# Patient Record
Sex: Female | Born: 1995 | Race: Black or African American | Hispanic: No | Marital: Single | Smoking: Never smoker
Health system: Southern US, Community
[De-identification: ages and names within clinical notes are randomized; demographics above are authoritative.]

## PROBLEM LIST (undated history)

## (undated) DIAGNOSIS — N62 Hypertrophy of breast: Secondary | ICD-10-CM

## (undated) DIAGNOSIS — T883XXA Malignant hyperthermia due to anesthesia, initial encounter: Secondary | ICD-10-CM

## (undated) DIAGNOSIS — F419 Anxiety disorder, unspecified: Secondary | ICD-10-CM

## (undated) DIAGNOSIS — Z8489 Family history of other specified conditions: Secondary | ICD-10-CM

## (undated) DIAGNOSIS — R519 Headache, unspecified: Secondary | ICD-10-CM

## (undated) DIAGNOSIS — S43006A Unspecified dislocation of unspecified shoulder joint, initial encounter: Secondary | ICD-10-CM

## (undated) DIAGNOSIS — F988 Other specified behavioral and emotional disorders with onset usually occurring in childhood and adolescence: Secondary | ICD-10-CM

## (undated) DIAGNOSIS — F32A Depression, unspecified: Secondary | ICD-10-CM

## (undated) HISTORY — DX: Malignant hyperthermia due to anesthesia, initial encounter: T88.3XXA

## (undated) HISTORY — DX: Anxiety disorder, unspecified: F41.9

## (undated) HISTORY — PX: SHOULDER SURGERY: SHX246

## (undated) HISTORY — DX: Headache, unspecified: R51.9

---
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
## 2016-05-06 IMAGING — CR DG FOOT COMPLETE 3+V*R*
3 series · 3 of 3 positions shown · non-contrast
Comparison: None.

CLINICAL DATA: 18-year-old female with right foot pain and
swelling.

EXAM:
RIGHT FOOT COMPLETE - 3+ VIEW

[t foot ap right]
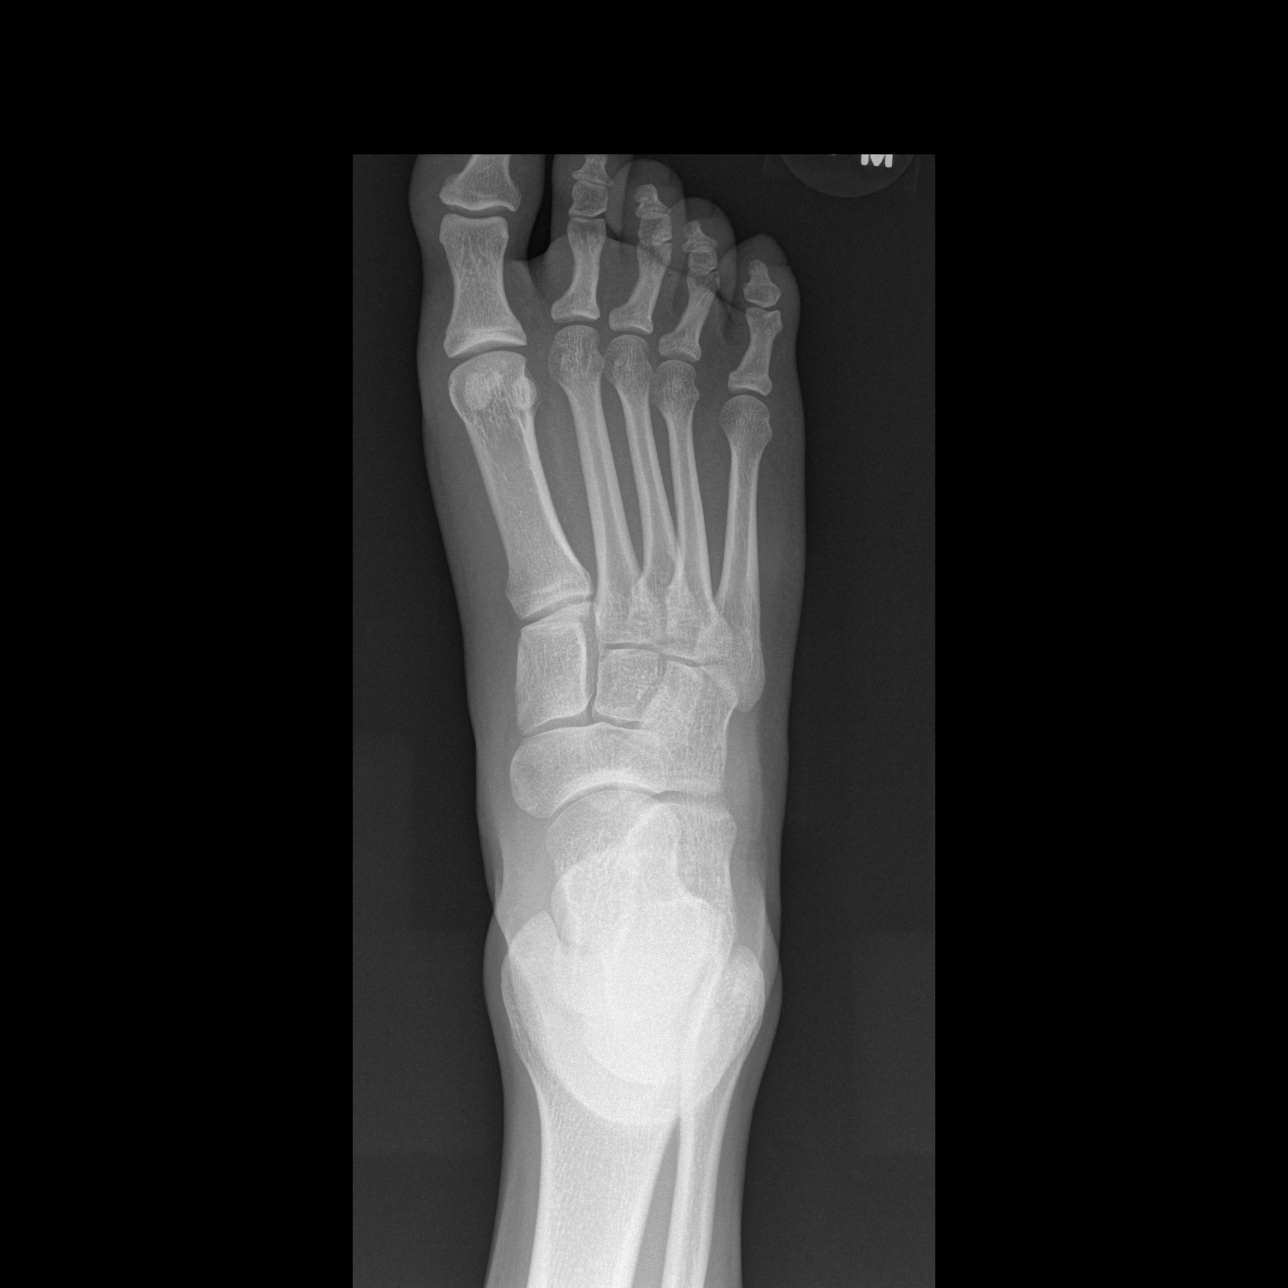

[t foot oblique right]
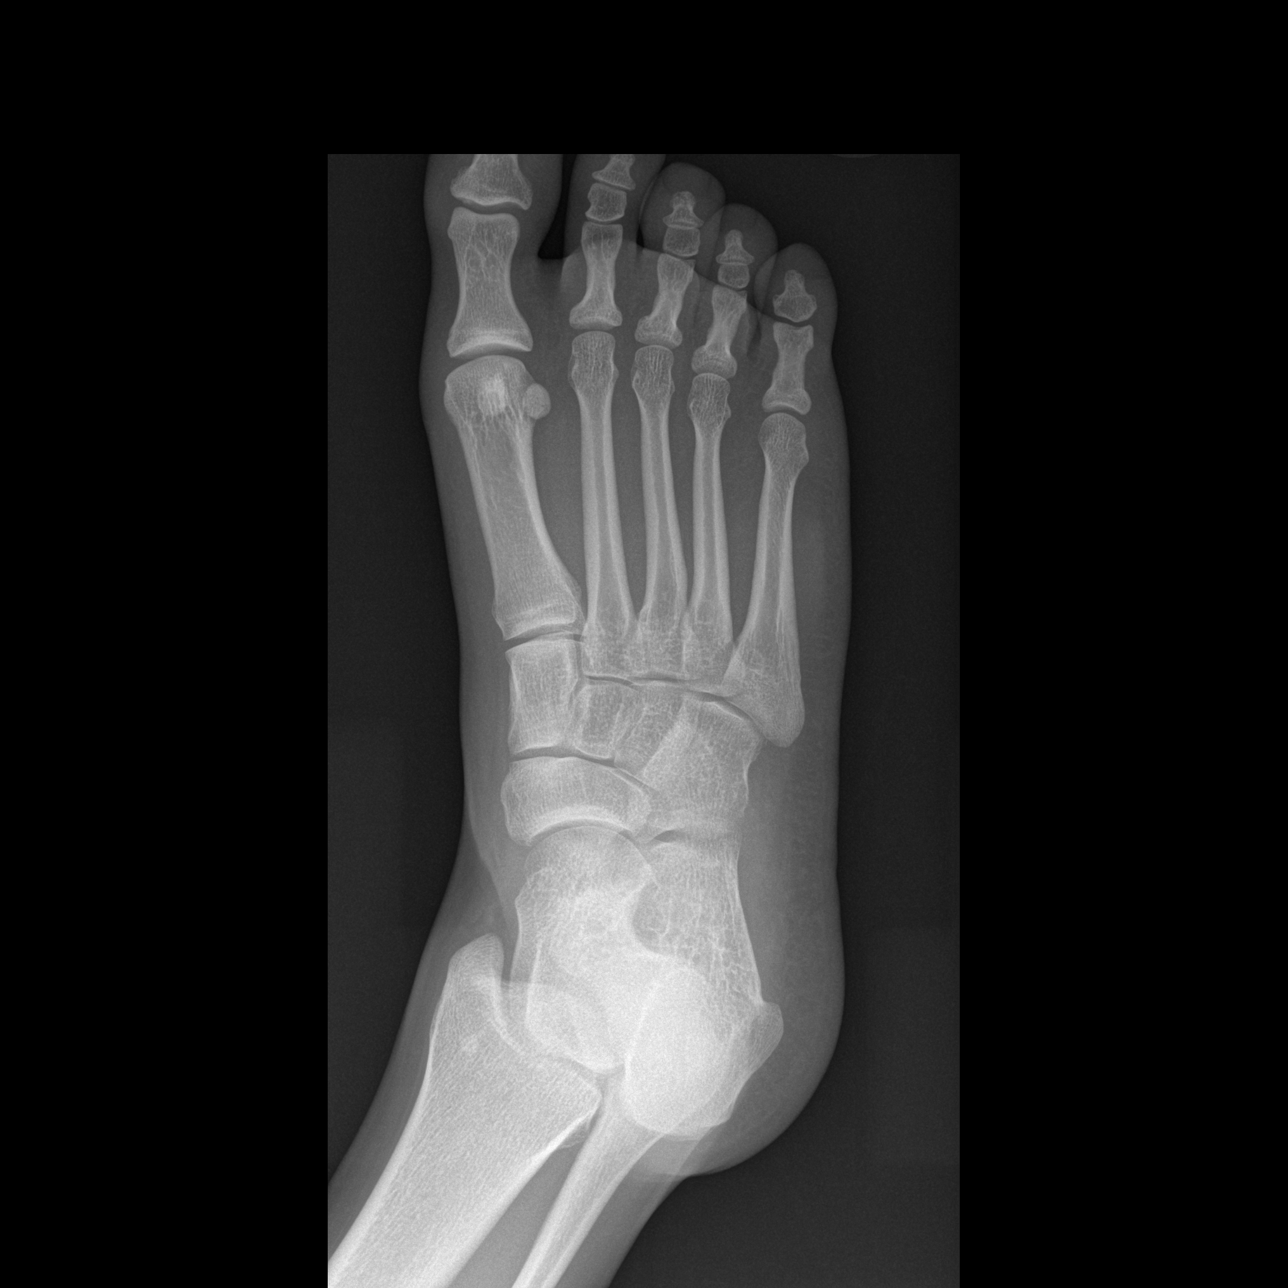

[t foot lat right]
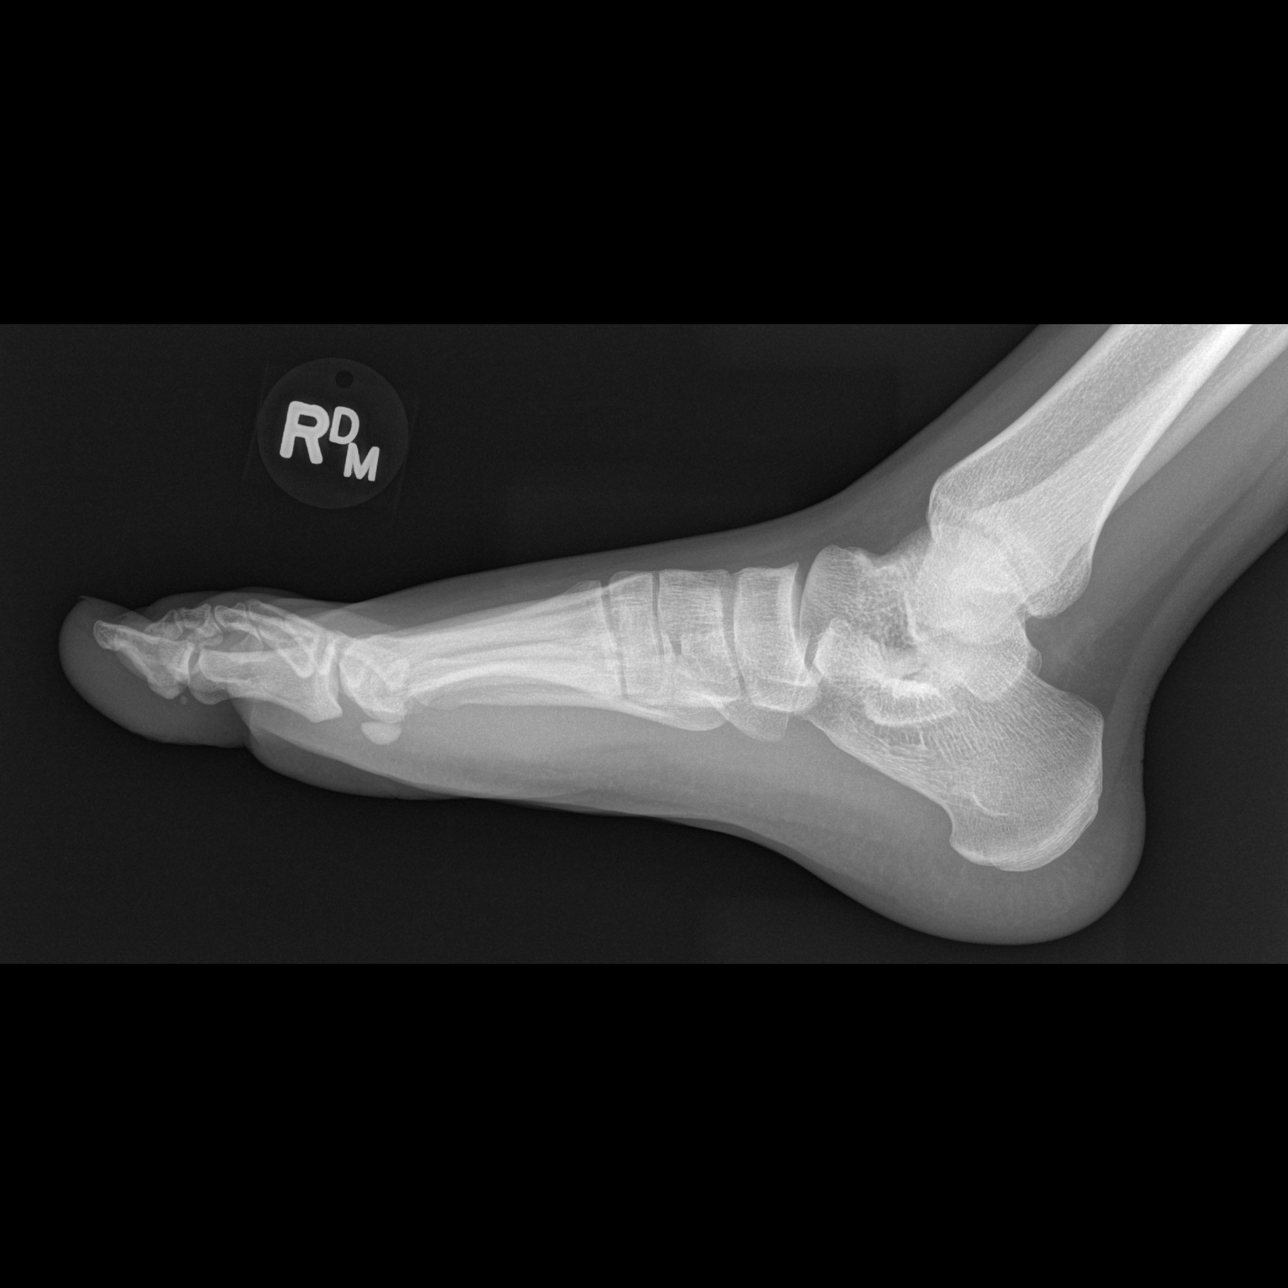

[3 of 3 positions shown; findings below may reference images not displayed]

FINDINGS: There is no evidence of fracture or dislocation. There is no
evidence of arthropathy or other focal bone abnormality. Soft
tissues are unremarkable.
IMPRESSION: Negative.

## 2017-07-13 ENCOUNTER — Emergency Department (HOSPITAL_BASED_OUTPATIENT_CLINIC_OR_DEPARTMENT_OTHER)
Admission: EM | Admit: 2017-07-13 | Discharge: 2017-07-13 | Disposition: A | Discharge status: Home / Self Care | Payer: 59 | Attending: Emergency Medicine | Admitting: Emergency Medicine

## 2017-07-13 ENCOUNTER — Encounter (HOSPITAL_BASED_OUTPATIENT_CLINIC_OR_DEPARTMENT_OTHER): Payer: Self-pay | Admitting: *Deleted

## 2017-07-13 DIAGNOSIS — Y998 Other external cause status: Secondary | ICD-10-CM | POA: Insufficient documentation

## 2017-07-13 DIAGNOSIS — Z79899 Other long term (current) drug therapy: Secondary | ICD-10-CM | POA: Diagnosis not present

## 2017-07-13 DIAGNOSIS — G501 Atypical facial pain: Secondary | ICD-10-CM | POA: Insufficient documentation

## 2017-07-13 DIAGNOSIS — W2210XA Striking against or struck by unspecified automobile airbag, initial encounter: Secondary | ICD-10-CM | POA: Insufficient documentation

## 2017-07-13 DIAGNOSIS — Y9389 Activity, other specified: Secondary | ICD-10-CM | POA: Insufficient documentation

## 2017-07-13 DIAGNOSIS — Y92410 Unspecified street and highway as the place of occurrence of the external cause: Secondary | ICD-10-CM | POA: Diagnosis not present

## 2017-07-13 LAB — PREGNANCY, URINE: PREG TEST UR: NEGATIVE

## 2017-07-13 MED ORDER — IBUPROFEN 800 MG PO TABS: 800.0000 mg | ORAL_TABLET | Freq: Three times a day (TID) | ORAL | 0 refills | Status: DC

## 2017-07-13 NOTE — ED Notes (Signed)
ED Provider at bedside. 
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
## 2017-07-13 NOTE — Discharge Instructions (Signed)
Ice to painful areas.  Expect more stiffness and soreness tomorrow.  Ibuprofen for pain.

## 2018-02-26 NOTE — Pre-Procedure Instructions (Signed)
Jill Schwartz Jill Schwartz  02/26/2018      [facility] [id number] - [place], [place] - [address] [id number] Phone: [phone] Fax: [phone]    Your procedure is scheduled on Friday 03/01/2018.  [facility].M.  Call this number if you have problems the morning of surgery:  [phone]   Remember:  Do not eat food or drink liquids after midnight.  Take these medicines the morning of surgery with A SIP OF WATER: medroxyprogesterone (Provera)  7 days prior to surgery STOP taking any Aspirin(unless otherwise instructed by your surgeon), Aleve, Naproxen, Ibuprofen, Motrin, Advil, Goody's, BC's, all herbal medications, fish oil, and all vitamins   Do not wear jewelry, make-up or nail polish.  Do not wear lotions, powders, or perfumes, or deodorant.  Do not shave 48 hours prior to surgery.    Do not bring valuables to the hospital.  [name] is not responsible for any belongings or valuables.  Hearing aids, eyeglasses, contacts, dentures or bridgework may not be worn into surgery.  Leave your suitcase in the car.  After surgery it may be brought to your room.  For patients admitted to the hospital, discharge time will be determined by your treatment team.  Patients discharged the day of surgery will not be allowed to drive home.   Name and phone number of your driver:    Special instructions:   [facility]- Preparing For Surgery  Before surgery, you can play an important role. Because skin is not sterile, your skin needs to be as free of germs as possible. You can reduce the number of germs on your skin by washing with CHG (chlorahexidine gluconate) Soap before surgery.  CHG is an antiseptic cleaner which kills germs and bonds with the skin to continue killing germs even after washing.  Please do not use if you have an allergy to CHG or antibacterial soaps. If your  skin becomes reddened/irritated stop using the CHG.  Do not shave (including legs and underarms) for at least 48 hours prior to first CHG shower. It is OK to shave your face.  Please follow these instructions carefully.   1. Shower the NIGHT BEFORE SURGERY and the MORNING OF SURGERY with CHG.   2. If you chose to wash your hair, wash your hair first as usual with your normal shampoo.  3. After you shampoo, rinse your hair and body thoroughly to remove the shampoo.  4. Use CHG as you would any other liquid soap. You can apply CHG directly to the skin and wash gently with a scrungie or a clean washcloth.   5. Apply the CHG Soap to your body ONLY FROM THE NECK DOWN.  Do not use on open wounds or open sores. Avoid contact with your eyes, ears, mouth and genitals (private parts). Wash Face and genitals (private parts)  with your normal soap.  6. Wash thoroughly, paying special attention to the area where your surgery will be performed.  7. Thoroughly rinse your body with warm water from the neck down.  8. DO NOT shower/wash with your normal soap after using and rinsing off the CHG Soap.  9. Pat yourself dry with a CLEAN TOWEL.  10. Wear CLEAN PAJAMAS to bed the night before surgery, wear comfortable clothes the morning of surgery  11. Place CLEAN SHEETS on your bed the night of  your first shower and DO NOT SLEEP WITH PETS.    Day of Surgery: Shower as stated above.  Do not apply any deodorants/lotions. Please wear clean clothes to the hospital/surgery center.      Please read over the following fact sheets that you were given.

## 2018-02-27 ENCOUNTER — Encounter (HOSPITAL_COMMUNITY): Payer: Self-pay

## 2018-02-27 ENCOUNTER — Encounter (HOSPITAL_COMMUNITY)
Admission: RE | Admit: 2018-02-27 | Discharge: 2018-02-27 | Disposition: A | Discharge status: Home / Self Care | Payer: 59 | Source: Ambulatory Visit | Attending: Plastic Surgery | Admitting: Plastic Surgery

## 2018-02-27 ENCOUNTER — Other Ambulatory Visit: Payer: Self-pay

## 2018-02-27 DIAGNOSIS — M549 Dorsalgia, unspecified: Secondary | ICD-10-CM | POA: Diagnosis not present

## 2018-02-27 DIAGNOSIS — M542 Cervicalgia: Secondary | ICD-10-CM | POA: Diagnosis not present

## 2018-02-27 DIAGNOSIS — N62 Hypertrophy of breast: Secondary | ICD-10-CM | POA: Diagnosis present

## 2018-02-27 HISTORY — DX: Family history of other specified conditions: Z84.89

## 2018-02-27 HISTORY — DX: Hypertrophy of breast: N62

## 2018-02-27 LAB — BASIC METABOLIC PANEL
Anion gap: 8 (ref 5–15)
BUN: 14 mg/dL (ref 6–20)
CHLORIDE: 109 mmol/L (ref 101–111)
CO2: 21 mmol/L — ABNORMAL LOW (ref 22–32)
CREATININE: 0.86 mg/dL (ref 0.44–1.00)
Calcium: 9.3 mg/dL (ref 8.9–10.3)
Glucose, Bld: 100 mg/dL — ABNORMAL HIGH (ref 65–99)
POTASSIUM: 4 mmol/L (ref 3.5–5.1)
SODIUM: 138 mmol/L (ref 135–145)

## 2018-02-27 LAB — CBC
HCT: 40.6 % (ref 36.0–46.0)
Hemoglobin: 13.5 g/dL (ref 12.0–15.0)
MCH: 28.9 pg (ref 26.0–34.0)
MCHC: 33.3 g/dL (ref 30.0–36.0)
MCV: 86.9 fL (ref 78.0–100.0)
PLATELETS: 335 10*3/uL (ref 150–400)
RBC: 4.67 MIL/uL (ref 3.87–5.11)
RDW: 13.4 % (ref 11.5–15.5)
WBC: 7 10*3/uL (ref 4.0–10.5)

## 2018-02-27 NOTE — Progress Notes (Signed)
PCP - Dr. Carney BernMeagan Hancock- [facility]  Cardiologist - Denies  Chest x-ray - Denies  EKG - Denies  Stress Test - Denies  ECHO - Denies  Cardiac Cath - Denies  Sleep Study - Denies CPAP - None  LABS- 02/27/18: CBC, BMP  U-PREG- 03/01/18  Anesthesia- No  Pt denies having chest pain, sob, or fever at this time. All instructions explained to the pt, with a verbal understanding of the material. Pt agrees to go over the instructions while at home for a better understanding. The opportunity to ask questions was provided.

## 2018-02-28 NOTE — H&P (Signed)
Subjective:     Patient ID: Jill Schwartz is a 22 y.o. female.  HPI  Here for follow up discussion prior to planned breast reduction. Current 34 DD and complains of several year history neck and back and shoulder pain that she attributes to weight of breast. Denies rashes. Denies numbness hands. She has tried NSAIDs, heat packs, speciatly fitted bras, physical therapy, wt loss program without relief. Notes pain in shoulders constant from bra straps. 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  Wt- highest 181 lb. Completed monitored wt loss program from May to August 2018, attained goal 165.   No prior MMG. Maternal great aunt with history breast ca.  Does have history keloid scarring ears, umbilical piercing.  Review of Systems  Musculoskeletal: Positive for back pain and neck pain.     Remaidner 12 point review negative Objective:   Physical Exam  Cardiovascular: Normal rate, regular rhythm and normal heart sounds.   Pulmonary/Chest: Effort normal and breath sounds normal.  Skin:  Fitzpatrick 6   Grade 3 ptosis bilateral No masses SN to nipple R 27.5 L 27 cm BW R 18 L 18  Nipple to IMF R 12 L 12 cm + shoulder grooving +thoracic kyphosis    Assessment:     Macromastia Chronic neck and back pain    Plan:     Chronic neck and back pain in setting macromastia that has failed conservative measures and creates impairment in basic tasks of bending lifting and limits ability to work, associated shoulder grooving, thoracic kyphosis. I anticipate reduction would allow significant pain relief.  Reviewed reduction with anchor type scars, possible overnight hospital stay or drains, post operative visits and limitations, recovery. Diminished sensation nipple and breast skin, risk of nipple loss, wound healing problems, asymmetry, incidental carcinoma, changes with wt gain/loss, aging, pregnancy, effect on breast feeding.  unacceptable cosmetic appearance reviewed.  Plan OP surgery. Additional risks including but not limited to DVT/PE, cardiopulmonary complications, fat necrosis, seroma, hematoma reviewed.Reviewed cannot assure cup size, discussed anticipate volume resection.   Rx for Norco given.   Anticipate 485 g reduction from each breast.    Glenna FellowsBrinda Bronson Bressman, MD [facility] Pl[facility] Re[facility] [phone], pin [phone]

## 2018-02-28 NOTE — Anesthesia Preprocedure Evaluation (Addendum)
Anesthesia Evaluation  Patient identified by MRN, date of birth, ID band Patient awake    Reviewed: Allergy & Precautions, H&P , NPO status , Patient's Chart, lab work & pertinent test results  History of Anesthesia Complications (+) MALIGNANT HYPERTHERMIA and Family history of anesthesia reaction  Airway Mallampati: II  TM Distance: >3 FB Neck ROM: Full    Dental no notable dental hx. (+) Teeth Intact, Dental Advisory Given   Pulmonary neg pulmonary ROS,    Pulmonary exam normal breath sounds clear to auscultation       Cardiovascular Exercise Tolerance: Good negative cardio ROS   Rhythm:Regular Rate:Normal     Neuro/Psych negative neurological ROS  negative psych ROS   GI/Hepatic negative GI ROS, Neg liver ROS,   Endo/Other  negative endocrine ROS  Renal/GU negative Renal ROS  negative genitourinary   Musculoskeletal   Abdominal   Peds  Hematology negative hematology ROS (+)   Anesthesia Other Findings   Reproductive/Obstetrics negative OB ROS                            Anesthesia Physical Anesthesia Plan  ASA: I  Anesthesia Plan: General   Post-op Pain Management:    Induction: Intravenous  PONV Risk Score and Plan: 4 or greater and Ondansetron, Dexamethasone and Midazolam  Airway Management Planned: Oral ETT  Additional Equipment:   Intra-op Plan:   Post-operative Plan: Extubation in OR  Informed Consent: I have reviewed the patients History and Physical, chart, labs and discussed the procedure including the risks, benefits and alternatives for the proposed anesthesia with the patient or authorized representative who has indicated his/her understanding and acceptance.   Dental advisory given  Plan Discussed with: CRNA  Anesthesia Plan Comments:         Anesthesia Quick Evaluation

## 2018-03-01 ENCOUNTER — Encounter (HOSPITAL_COMMUNITY)
Admission: RE | Disposition: A | Discharge status: Home / Self Care | Payer: Self-pay | Source: Ambulatory Visit | Attending: Plastic Surgery

## 2018-03-01 ENCOUNTER — Ambulatory Visit (HOSPITAL_COMMUNITY): Payer: 59 | Admitting: Anesthesiology

## 2018-03-01 ENCOUNTER — Ambulatory Visit (HOSPITAL_COMMUNITY)
Admission: RE | Admit: 2018-03-01 | Discharge: 2018-03-01 | Disposition: A | Discharge status: Home / Self Care | Payer: 59 | Source: Ambulatory Visit | Attending: Plastic Surgery | Admitting: Plastic Surgery

## 2018-03-01 ENCOUNTER — Encounter (HOSPITAL_COMMUNITY): Payer: Self-pay | Admitting: *Deleted

## 2018-03-01 DIAGNOSIS — N62 Hypertrophy of breast: Secondary | ICD-10-CM | POA: Insufficient documentation

## 2018-03-01 DIAGNOSIS — M542 Cervicalgia: Secondary | ICD-10-CM | POA: Insufficient documentation

## 2018-03-01 DIAGNOSIS — M549 Dorsalgia, unspecified: Secondary | ICD-10-CM | POA: Insufficient documentation

## 2018-03-01 HISTORY — PX: BREAST REDUCTION SURGERY: SHX8

## 2018-03-01 SURGERY — MAMMOPLASTY, REDUCTION
Anesthesia: General | Site: Breast | Laterality: Bilateral

## 2018-03-01 MED ORDER — MIDAZOLAM HCL 2 MG/2ML IJ SOLN
INTRAMUSCULAR | Status: AC
  Filled 2018-03-01: qty 2

## 2018-03-01 MED ORDER — LIDOCAINE HCL (CARDIAC) PF 100 MG/5ML IV SOSY
PREFILLED_SYRINGE | INTRAVENOUS | Status: DC | PRN
  Administered 2018-03-01: 60 mg via INTRAVENOUS

## 2018-03-01 MED ORDER — ONDANSETRON HCL 4 MG/2ML IJ SOLN
INTRAMUSCULAR | Status: AC
  Filled 2018-03-01: qty 2

## 2018-03-01 MED ORDER — CLINDAMYCIN PHOSPHATE 900 MG/50ML IV SOLN
900.0000 mg | INTRAVENOUS | Status: AC
  Administered 2018-03-01: 900 mg via INTRAVENOUS
  Filled 2018-03-01: qty 50

## 2018-03-01 MED ORDER — FENTANYL CITRATE (PF) 250 MCG/5ML IJ SOLN
INTRAMUSCULAR | Status: AC
  Filled 2018-03-01: qty 5

## 2018-03-01 MED ORDER — DEXAMETHASONE SODIUM PHOSPHATE 10 MG/ML IJ SOLN
INTRAMUSCULAR | Status: DC | PRN
  Administered 2018-03-01: 10 mg via INTRAVENOUS

## 2018-03-01 MED ORDER — HEPARIN SODIUM (PORCINE) 5000 UNIT/ML IJ SOLN
5000.0000 [IU] | Freq: Once | INTRAMUSCULAR | Status: AC
  Administered 2018-03-01: 5000 [IU] via SUBCUTANEOUS
  Filled 2018-03-01: qty 1

## 2018-03-01 MED ORDER — CHLORHEXIDINE GLUCONATE CLOTH 2 % EX PADS: 6.0000 | MEDICATED_PAD | Freq: Once | CUTANEOUS | Status: DC

## 2018-03-01 MED ORDER — PROPOFOL 10 MG/ML IV BOLUS
INTRAVENOUS | Status: DC | PRN
  Administered 2018-03-01: 150 mg via INTRAVENOUS

## 2018-03-01 MED ORDER — BUPIVACAINE LIPOSOME 1.3 % IJ SUSP
20.0000 mL | INTRAMUSCULAR | Status: DC
  Filled 2018-03-01: qty 20

## 2018-03-01 MED ORDER — 0.9 % SODIUM CHLORIDE (POUR BTL) OPTIME
TOPICAL | Status: DC | PRN
  Administered 2018-03-01: 1000 mL

## 2018-03-01 MED ORDER — BUPIVACAINE LIPOSOME 1.3 % IJ SUSP
INTRAMUSCULAR | Status: DC | PRN
  Administered 2018-03-01: 20 mL

## 2018-03-01 MED ORDER — ONDANSETRON HCL 4 MG/2ML IJ SOLN
INTRAMUSCULAR | Status: DC | PRN
  Administered 2018-03-01: 4 mg via INTRAVENOUS

## 2018-03-01 MED ORDER — GABAPENTIN 300 MG PO CAPS
300.0000 mg | ORAL_CAPSULE | ORAL | Status: AC
  Administered 2018-03-01: 300 mg via ORAL
  Filled 2018-03-01: qty 1

## 2018-03-01 MED ORDER — SUGAMMADEX SODIUM 200 MG/2ML IV SOLN
INTRAVENOUS | Status: AC
  Filled 2018-03-01: qty 2

## 2018-03-01 MED ORDER — ACETAMINOPHEN 500 MG PO TABS
1000.0000 mg | ORAL_TABLET | ORAL | Status: AC
  Administered 2018-03-01: 1000 mg via ORAL
  Filled 2018-03-01: qty 2

## 2018-03-01 MED ORDER — DEXAMETHASONE SODIUM PHOSPHATE 10 MG/ML IJ SOLN
INTRAMUSCULAR | Status: AC
  Filled 2018-03-01: qty 1

## 2018-03-01 MED ORDER — HYDROMORPHONE HCL 2 MG/ML IJ SOLN
0.2500 mg | INTRAMUSCULAR | Status: DC | PRN
  Administered 2018-03-01 (×2): 0.5 mg via INTRAVENOUS

## 2018-03-01 MED ORDER — ROCURONIUM BROMIDE 100 MG/10ML IV SOLN
INTRAVENOUS | Status: DC | PRN
  Administered 2018-03-01: 60 mg via INTRAVENOUS

## 2018-03-01 MED ORDER — PROPOFOL 500 MG/50ML IV EMUL
INTRAVENOUS | Status: DC | PRN
  Administered 2018-03-01: 150 ug/kg/min via INTRAVENOUS
  Administered 2018-03-01: 09:00:00 via INTRAVENOUS

## 2018-03-01 MED ORDER — CELECOXIB 200 MG PO CAPS
200.0000 mg | ORAL_CAPSULE | ORAL | Status: AC
  Administered 2018-03-01: 200 mg via ORAL
  Filled 2018-03-01: qty 1

## 2018-03-01 MED ORDER — FENTANYL CITRATE (PF) 100 MCG/2ML IJ SOLN
INTRAMUSCULAR | Status: DC | PRN
  Administered 2018-03-01: 150 ug via INTRAVENOUS
  Administered 2018-03-01 (×2): 50 ug via INTRAVENOUS

## 2018-03-01 MED ORDER — LIDOCAINE 2% (20 MG/ML) 5 ML SYRINGE
INTRAMUSCULAR | Status: AC
  Filled 2018-03-01: qty 5

## 2018-03-01 MED ORDER — MIDAZOLAM HCL 5 MG/5ML IJ SOLN
INTRAMUSCULAR | Status: DC | PRN
  Administered 2018-03-01: 2 mg via INTRAVENOUS

## 2018-03-01 MED ORDER — HYDROMORPHONE HCL 2 MG/ML IJ SOLN
INTRAMUSCULAR | Status: AC
  Administered 2018-03-01: 0.5 mg via INTRAVENOUS
  Filled 2018-03-01: qty 1

## 2018-03-01 MED ORDER — LACTATED RINGERS IV SOLN
INTRAVENOUS | Status: DC | PRN
  Administered 2018-03-01 (×2): via INTRAVENOUS

## 2018-03-01 MED ORDER — PROPOFOL 10 MG/ML IV BOLUS
INTRAVENOUS | Status: AC
  Filled 2018-03-01: qty 20

## 2018-03-01 MED ORDER — SODIUM CHLORIDE 0.9% FLUSH
INTRAVENOUS | Status: DC | PRN
  Administered 2018-03-01: 20 mL

## 2018-03-01 MED ORDER — SUGAMMADEX SODIUM 200 MG/2ML IV SOLN
INTRAVENOUS | Status: DC | PRN
  Administered 2018-03-01: 175 mg via INTRAVENOUS

## 2018-03-01 SURGICAL SUPPLY — 56 items
BINDER BREAST LRG (GAUZE/BANDAGES/DRESSINGS) IMPLANT
BINDER BREAST MEDIUM (GAUZE/BANDAGES/DRESSINGS) IMPLANT
BINDER BREAST XLRG (GAUZE/BANDAGES/DRESSINGS) ×3 IMPLANT
BINDER BREAST XXLRG (GAUZE/BANDAGES/DRESSINGS) IMPLANT
BLADE HEX COATED 2.75 (ELECTRODE) ×3 IMPLANT
BLADE SURG 10 STRL SS (BLADE) ×12 IMPLANT
BLADE SURG 15 STRL LF DISP TIS (BLADE) IMPLANT
BLADE SURG 15 STRL SS (BLADE)
BNDG GAUZE ELAST 4 BULKY (GAUZE/BANDAGES/DRESSINGS) ×6 IMPLANT
CANISTER SUCTION 1200CC (MISCELLANEOUS) ×3 IMPLANT
CHLORAPREP W/TINT 26ML (MISCELLANEOUS) ×6 IMPLANT
DECANTER SPIKE VIAL GLASS SM (MISCELLANEOUS) IMPLANT
DERMABOND ADVANCED (GAUZE/BANDAGES/DRESSINGS) ×4
DERMABOND ADVANCED .7 DNX12 (GAUZE/BANDAGES/DRESSINGS) ×2 IMPLANT
DRAIN CHANNEL 15F RND FF W/TCR (WOUND CARE) ×6 IMPLANT
DRAPE LAPAROSCOPIC ABDOMINAL (DRAPES) IMPLANT
DRAPE ORTHO SPLIT 77X108 STRL (DRAPES) ×4
DRAPE SURG ORHT 6 SPLT 77X108 (DRAPES) ×2 IMPLANT
DRSG PAD ABDOMINAL 8X10 ST (GAUZE/BANDAGES/DRESSINGS) ×6 IMPLANT
ELECT BLADE 4.0 EZ CLEAN MEGAD (MISCELLANEOUS) ×3
ELECT COATED BLADE 2.86 ST (ELECTRODE) ×3 IMPLANT
ELECT REM PT RETURN 9FT ADLT (ELECTROSURGICAL) ×3
ELECTRODE BLDE 4.0 EZ CLN MEGD (MISCELLANEOUS) ×1 IMPLANT
ELECTRODE REM PT RTRN 9FT ADLT (ELECTROSURGICAL) ×1 IMPLANT
EVACUATOR SILICONE 100CC (DRAIN) ×6 IMPLANT
GLOVE BIO SURGEON STRL SZ 6 (GLOVE) ×6 IMPLANT
GLOVE BIO SURGEON STRL SZ7.5 (GLOVE) ×3 IMPLANT
GLOVE BIOGEL PI IND STRL 7.5 (GLOVE) ×2 IMPLANT
GLOVE BIOGEL PI INDICATOR 7.5 (GLOVE) ×4
GLOVE ECLIPSE 7.5 STRL STRAW (GLOVE) ×3 IMPLANT
GLOVE SURG SS PI 7.0 STRL IVOR (GLOVE) ×9 IMPLANT
GLOVE SURG SS PI 7.5 STRL IVOR (GLOVE) ×3 IMPLANT
GOWN STRL REUS W/ TWL LRG LVL3 (GOWN DISPOSABLE) ×4 IMPLANT
GOWN STRL REUS W/ TWL XL LVL3 (GOWN DISPOSABLE) ×1 IMPLANT
GOWN STRL REUS W/TWL LRG LVL3 (GOWN DISPOSABLE) ×8
GOWN STRL REUS W/TWL XL LVL3 (GOWN DISPOSABLE) ×2
NEEDLE HYPO 22GX1.5 SAFETY (NEEDLE) ×3 IMPLANT
NEEDLE HYPO 25GX1X1/2 BEV (NEEDLE) ×3 IMPLANT
NS IRRIG 1000ML POUR BTL (IV SOLUTION) ×3 IMPLANT
PACK GENERAL/GYN (CUSTOM PROCEDURE TRAY) ×3 IMPLANT
PAD ABD 8X10 STRL (GAUZE/BANDAGES/DRESSINGS) ×6 IMPLANT
PEN SKIN MARKING BROAD (MISCELLANEOUS) ×3 IMPLANT
SPONGE LAP 18X18 X RAY DECT (DISPOSABLE) ×3 IMPLANT
STAPLER VISISTAT 35W (STAPLE) ×3 IMPLANT
SUT ETHILON 2 0 FS 18 (SUTURE) ×6 IMPLANT
SUT MNCRL AB 4-0 PS2 18 (SUTURE) ×12 IMPLANT
SUT VIC AB 3-0 PS1 18 (SUTURE) ×14
SUT VIC AB 3-0 PS1 18XBRD (SUTURE) ×7 IMPLANT
SUT VIC AB 3-0 PS2 18 (SUTURE) ×3 IMPLANT
SUT VIC AB 3-0 PS2 18XBRD (SUTURE) IMPLANT
SUT VIC AB 3-0 SH 27 (SUTURE)
SUT VIC AB 3-0 SH 27X BRD (SUTURE) IMPLANT
SUT VICRYL 4-0 PS2 18IN ABS (SUTURE) ×6 IMPLANT
SYR CONTROL 10ML LL (SYRINGE) ×3 IMPLANT
TOWEL OR 17X24 6PK STRL BLUE (TOWEL DISPOSABLE) ×6 IMPLANT
YANKAUER SUCT BULB TIP NO VENT (SUCTIONS) ×3 IMPLANT

## 2018-03-01 NOTE — Anesthesia Procedure Notes (Signed)
Procedure Name: Intubation Date/Time: 03/01/2018 7:36 AM Performed by: Lovie Cholock, Eulah Walkup K, CRNA Pre-anesthesia Checklist: Patient identified, Emergency Drugs available, Suction available and Patient being monitored Patient Re-evaluated:Patient Re-evaluated prior to induction Oxygen Delivery Method: Circle System Utilized Preoxygenation: Pre-oxygenation with 100% oxygen Induction Type: IV induction Ventilation: Mask ventilation without difficulty Laryngoscope Size: Miller and 2 Grade View: Grade II Tube type: Oral Tube size: 7.0 mm Number of attempts: 1 Airway Equipment and Method: Stylet and Oral airway Placement Confirmation: ETT inserted through vocal cords under direct vision,  positive ETCO2 and breath sounds checked- equal and bilateral Secured at: 21 cm Tube secured with: Tape Dental Injury: Teeth and Oropharynx as per pre-operative assessment

## 2018-03-01 NOTE — Op Note (Signed)
Operative Note   DATE OF OPERATION: 4.19.19  LOCATION: [facility]patient  SURGICAL DIVISION: Plastic Surgery  PREOPERATIVE DIAGNOSES:  1. Macromastia 2. Chronic neck and back pain  POSTOPERATIVE DIAGNOSES:  same  PROCEDURE:  Bilateral breast reduction  SURGEON: Glenna FellowsBrinda Alexio Sroka MD MBA  ASSISTANCammie Sickle: S. Bowman RNFA  ANESTHESIA:  General.   EBL: 50 ml  COMPLICATIONS: None immediate.   INDICATIONS FOR PROCEDURE:  The patient, Jill Schwartz, is a 22 y.o. female born on May 03, 1996, is here for treatment of chronic neck and back pain in setting of macromastia that has failed conservative measures.   FINDINGS: Right reduction 400 g Left 353 g  DESCRIPTION OF PROCEDURE:  The patient was marked standing in the preoperative area to mark sternal notch, chest midline, anterior axillary lines, inframammary folds. The location of new nipple areolar complex was marked at level of on inframammary fold on anterior surface breast by palpation. This was marked symmetric over bilateral breasts. With aid of Wise pattern marker, location of new nipple areolar complex and vertical limbs (7.5cm) were marked. The patient was taken to the operating room. SCDs were placed and IV antibiotics were given. The patient's operative site was prepped and draped in a sterile fashion. A time out was performed and all information was confirmed to be correct.   Over left breast, superomedial pedicle marked and nipple areolar complex marked with8445mm diameter marker. Pedicle deepithlialized and developedto chest wall. Breast tissue resected over lower pole. Medial and lateral flaps developed.Additional superior pole and lateral chest wall tissue excised.Breast tailor tacked closed.  I then directed attention to right breast where superomedial pedicle designed. The pedicle was deepithelialized and developed to 5-6 cm thickness and toward chest wall until tension free closure obtained. Lower pole, lateral chest  wall, and superior pole breast tissuee excised. Breast tailor tacked closed, and patient brought to upright sitting position and assessed for symmetry. Patent returned to supine position and breast cavities irrigated and hemostasis obtained. Exparel infiltrated throughout each breast. 15 Fr JP placed in each breast and secured with 2-0 nylon. Closure completed with 3-0 vicryl to approximate dermis along inframammary fold and vertical limb. NAC inset with 4-0 vicryl in dermis. Skin closure completed with 4-0 monocryl subcuticular throughout.Tissue adhesive applied. Dry dressing and breast binder applied.  The patient was allowed to wake from anesthesia, extubated and taken to the recovery room in satisfactory condition.   SPECIMENS: right and left breast reduction  DRAINS: 15 Fr JP in right and left breast  Glenna FellowsBrinda Tokiko Diefenderfer, MD [facility] Plastic & Reconstructive Surgery [phone], pin [phone]

## 2018-03-01 NOTE — Interval H&P Note (Signed)
History and Physical Interval Note:  03/01/2018 6:49 AM  Jill Schwartz  has presented today for surgery, with the diagnosis of MAMARY HYPERTROPHY  The various methods of treatment have been discussed with the patient and family. After consideration of risks, benefits and other options for treatment, the patient has consented to  Procedure(s): BILATERAL MAMMARY REDUCTION  (BREAST) (Bilateral) as a surgical intervention .  The patient's history has been reviewed, patient examined, no change in status, stable for surgery.  I have reviewed the patient's chart and labs.  Questions were answered to the patient's satisfaction.     Drystan Reader

## 2018-03-01 NOTE — Transfer of Care (Signed)
Immediate Anesthesia Transfer of Care Note  Patient: Jill Schwartz  Procedure(s) Performed: BILATERAL MAMMARY REDUCTION  (BREAST) (Bilateral Breast)  Patient Location: PACU  Anesthesia Type:General  Level of Consciousness: oriented, drowsy and patient cooperative  Airway & Oxygen Therapy: Patient Spontanous Breathing and Patient connected to nasal cannula oxygen  Post-op Assessment: Report given to RN and Post -op Vital signs reviewed and stable  Post vital signs: Reviewed  Last Vitals:  Vitals Value Taken Time  BP 128/93 03/01/2018 10:50 AM  Temp    Pulse 92 03/01/2018 10:51 AM  Resp 21 03/01/2018 10:51 AM  SpO2 100 % 03/01/2018 10:51 AM  Vitals shown include unvalidated device data.  Last Pain:  Vitals:   03/01/18 0618  TempSrc:   PainSc: 0-No pain         Complications: No apparent anesthesia complications

## 2018-03-01 NOTE — Anesthesia Postprocedure Evaluation (Signed)
Anesthesia Post Note  Patient: Jill Schwartz  Procedure(s) Performed: BILATERAL MAMMARY REDUCTION  (BREAST) (Bilateral Breast)     Patient location during evaluation: PACU Anesthesia Type: General Level of consciousness: awake and alert Pain management: pain level controlled Vital Signs Assessment: post-procedure vital signs reviewed and stable Respiratory status: spontaneous breathing, nonlabored ventilation and respiratory function stable Cardiovascular status: blood pressure returned to baseline and stable Postop Assessment: no apparent nausea or vomiting Anesthetic complications: no    Last Vitals:  Vitals:   03/01/18 1145 03/01/18 1150  BP:  (!) 127/93  Pulse: 94 92  Resp: 17 20  Temp:  36.5 C  SpO2: 97% 95%    Last Pain:  Vitals:   03/01/18 1138  TempSrc:   PainSc: 9                  Miyo Aina,W. EDMOND

## 2018-03-02 ENCOUNTER — Encounter (HOSPITAL_COMMUNITY): Payer: Self-pay | Admitting: Plastic Surgery

## 2019-08-14 HISTORY — PX: BUTTOCK LIFT: SHX1278

## 2019-11-05 ENCOUNTER — Encounter (HOSPITAL_BASED_OUTPATIENT_CLINIC_OR_DEPARTMENT_OTHER): Payer: Self-pay

## 2019-11-05 ENCOUNTER — Emergency Department (HOSPITAL_BASED_OUTPATIENT_CLINIC_OR_DEPARTMENT_OTHER)
Admission: EM | Admit: 2019-11-05 | Discharge: 2019-11-05 | Disposition: A | Discharge status: Home / Self Care | Payer: 59 | Attending: Emergency Medicine | Admitting: Emergency Medicine

## 2019-11-05 ENCOUNTER — Other Ambulatory Visit: Payer: Self-pay

## 2019-11-05 DIAGNOSIS — R45851 Suicidal ideations: Secondary | ICD-10-CM | POA: Insufficient documentation

## 2019-11-05 DIAGNOSIS — F419 Anxiety disorder, unspecified: Secondary | ICD-10-CM

## 2019-11-05 DIAGNOSIS — F321 Major depressive disorder, single episode, moderate: Secondary | ICD-10-CM | POA: Insufficient documentation

## 2019-11-05 DIAGNOSIS — Z20828 Contact with and (suspected) exposure to other viral communicable diseases: Secondary | ICD-10-CM | POA: Insufficient documentation

## 2019-11-05 LAB — COMPREHENSIVE METABOLIC PANEL
ALT: 19 U/L (ref 0–44)
AST: 22 U/L (ref 15–41)
Albumin: 4.5 g/dL (ref 3.5–5.0)
Alkaline Phosphatase: 52 U/L (ref 38–126)
Anion gap: 10 (ref 5–15)
BUN: 13 mg/dL (ref 6–20)
CO2: 25 mmol/L (ref 22–32)
Calcium: 9.5 mg/dL (ref 8.9–10.3)
Chloride: 104 mmol/L (ref 98–111)
Creatinine, Ser: 0.89 mg/dL (ref 0.44–1.00)
GFR calc Af Amer: >60 mL/min (ref >60)
GFR calc non Af Amer: >60 mL/min (ref >60)
Glucose, Bld: 101 mg/dL — ABNORMAL HIGH (ref 70–99)
Potassium: 3.7 mmol/L (ref 3.5–5.1)
Sodium: 139 mmol/L (ref 135–145)
Total Bilirubin: 0.6 mg/dL (ref 0.3–1.2)
Total Protein: 7.5 g/dL (ref 6.5–8.1)

## 2019-11-05 LAB — CBC WITH DIFFERENTIAL/PLATELET
Abs Immature Granulocytes: 0.01 10*3/uL (ref 0.00–0.07)
Basophils Absolute: 0 10*3/uL (ref 0.0–0.1)
Basophils Relative: 1 %
Eosinophils Absolute: 0.1 10*3/uL (ref 0.0–0.5)
Eosinophils Relative: 2 %
HCT: 41.9 % (ref 36.0–46.0)
Hemoglobin: 14 g/dL (ref 12.0–15.0)
Immature Granulocytes: 0 %
Lymphocytes Relative: 48 %
Lymphs Abs: 2.6 10*3/uL (ref 0.7–4.0)
MCH: 29.1 pg (ref 26.0–34.0)
MCHC: 33.4 g/dL (ref 30.0–36.0)
MCV: 87.1 fL (ref 80.0–100.0)
Monocytes Absolute: 0.5 10*3/uL (ref 0.1–1.0)
Monocytes Relative: 9 %
Neutro Abs: 2.2 10*3/uL (ref 1.7–7.7)
Neutrophils Relative %: 40 %
Platelets: 373 10*3/uL (ref 150–400)
RBC: 4.81 MIL/uL (ref 3.87–5.11)
RDW: 13 % (ref 11.5–15.5)
WBC: 5.4 10*3/uL (ref 4.0–10.5)
nRBC: 0 % (ref 0.0–0.2)

## 2019-11-05 LAB — URINALYSIS, ROUTINE W REFLEX MICROSCOPIC
Bilirubin Urine: NEGATIVE
Glucose, UA: NEGATIVE mg/dL
Hgb urine dipstick: NEGATIVE
Ketones, ur: NEGATIVE mg/dL
Leukocytes,Ua: NEGATIVE
Nitrite: NEGATIVE
Protein, ur: NEGATIVE mg/dL
Specific Gravity, Urine: 1.02 (ref 1.005–1.030)
pH: 7 (ref 5.0–8.0)

## 2019-11-05 LAB — RESPIRATORY PANEL BY RT PCR (FLU A&B, COVID)
Influenza A by PCR: NEGATIVE
Influenza B by PCR: NEGATIVE
SARS Coronavirus 2 by RT PCR: NEGATIVE

## 2019-11-05 LAB — ETHANOL: Alcohol, Ethyl (B): <10 mg/dL (ref <10)

## 2019-11-05 LAB — RAPID URINE DRUG SCREEN, HOSP PERFORMED
Amphetamines: NOT DETECTED
Barbiturates: NOT DETECTED
Benzodiazepines: NOT DETECTED
Cocaine: NOT DETECTED
Opiates: NOT DETECTED
Tetrahydrocannabinol: NOT DETECTED

## 2019-11-05 LAB — PREGNANCY, URINE: Preg Test, Ur: NEGATIVE

## 2019-11-05 MED ORDER — HYDROXYZINE HCL 25 MG PO TABS: 25.0000 mg | ORAL_TABLET | Freq: Three times a day (TID) | ORAL | 0 refills | Status: DC | PRN

## 2019-11-05 MED ORDER — HYDROXYZINE HCL 25 MG PO TABS
25.0000 mg | ORAL_TABLET | Freq: Once | ORAL | Status: AC
  Administered 2019-11-05: 25 mg via ORAL
  Filled 2019-11-05: qty 1

## 2019-11-05 NOTE — ED Notes (Signed)
Pt verbalized understanding of dc instructions.

## 2019-11-05 NOTE — BH Assessment (Signed)
Tele Assessment Note   Patient Name: Jill Schwartz MRN: [id number] Referring Physician: EDP [facility] Location of Provider: Behavioral Health TTS Department  Nigel Roslynn AmbleJ Wrench is a 23 y.o. female who [facility] with complaint of body numbness linked to a panic attack.  [place] significant other, and she is employed.  Pt has not been assessed by TTS before, and she is not followed by an outpatient psychiatrist/therapist.  Pt reported that for the last two weeks, she has felt ''weird'' and ''uneasy'' inside, and that this morning, she experienced body numbness, which was determined to be caused by a panic attack.  Pt stated that she has never had a panic attack before.  Pt stated that recently her grandmother died, and as a result, she has felt anxious and sad.  Pt denied active suicidal ideation, but she experiences passive ideation --- ''I wish I were dead.''  Pt endorsed despondency, insomnia (about four hours of sleep per night), poor appetite with no apparent weight loss, feelings of worthlessness, isolation, and fatigue.  Pt denied hallucination, self-injurious behavior, homicidal ideation, and substance use concerns.  Pt reported that she attempted suicide once four years ago because she felt ''overwhelmed'' by graduation.  She received outpatient psychiatry treatment at the time.  Pt was diagnosed with depression.  Pt has also been diagnosed with ADHD.  She is not currently taking any psychotropic medication.  During assessment, Pt presented as alert and oriented.  She had good eye contact and was cooperative.  Pt was gowned and appeared appropriately groomed.  Pt's mood was reported as ''sad,'' and affect was blunted.  Pt's speech was normal in rate, rhythm, and volume.  Thought processes were within normal range, and thought content was logical and goal-oriented.  There was no evidence of delusion.  Pt's memory and concentration were intact.   Insight, judgment, and impulse control were fair.  Pt stated that she has support with her family, and she would like to be discharged with outpatient resources.  Author spoke with Pt's mother, Merrily BrittleChamika McQueen [phone]), to obtain collateral.  Pt's mother stated that she is able to check on Pt when discharged and that she feels comfortable with Pt being discharged.  Consulted with L. Maisie Fushomas, FNP, who determined that Pt may be discharged with oupatient resources.  Diagnosis: Major Depressive Disorder, Single Ep., Moderate with anxious features; ADHD  Past Medical History:  Past Medical History:  Diagnosis Date  . Attention deficit disorder   . Family history of adverse reaction to anesthesia    Family history of malignant hyperthermia  . Macromastia    Bilateral  . Shoulder dislocation     Past Surgical History:  Procedure Laterality Date  . BREAST REDUCTION SURGERY Bilateral 03/01/2018   Procedure: BILATERAL MAMMARY REDUCTION  (BREAST);  Surgeon: Glenna Fellowshimmappa, Brinda, MD;  Location: [facility] OR;  Service: Plastics;  Laterality: Bilateral;  . BUTTOCK LIFT  08/2019  . SHOULDER SURGERY Right     Family History: History reviewed. No pertinent family history.  Social History:  reports that she has never smoked. She has never used smokeless tobacco. She reports that she does not drink alcohol or use drugs.  Additional Social History:  Alcohol / Drug Use Pain Medications: See MAR Prescriptions: See MAR Over the Counter: See MAR History of alcohol / drug use?: No history of alcohol / drug abuse  CIWA: CIWA-Ar BP: (!) 129/91 Pulse Rate: 81 COWS:    Allergies:  Allergies  Allergen Reactions  . Penicillins Hives and Other (See Comments)    Has patient had a PCN reaction causing immediate rash, facial/tongue/throat swelling, SOB or lightheadedness with hypotension: Yes Has patient had a PCN reaction causing severe rash involving mucus membranes or skin necrosis: No Has patient had a  PCN reaction that required hospitalization: Yes Has patient had a PCN reaction occurring within the last 10 years: No If all of the above answers are "NO", then may proceed with Cephalosporin use.     Home Medications: (Not in a hospital admission)   OB/GYN Status:  Patient's last menstrual period was 09/23/2019.  General Assessment Data Location of Assessment: [facility] TTS Assessment: In system Is this a Tele or Face-to-Face Assessment?: Tele Assessment Is this an Initial Assessment or a Re-assessment for this encounter?: Initial Assessment Patient Accompanied by:: N/A Language Other than English: No Living Arrangements: Other (Comment)(Single family home) What gender do you identify as?: Female Marital status: Long term relationship Pregnancy Status: No Living Arrangements: Spouse/significant other Can pt return to current living arrangement?: Yes Admission Status: Voluntary Is patient capable of signing voluntary admission?: Yes Referral Source: Self/Family/Friend Insurance type: None     Crisis Care Plan Living Arrangements: Spouse/significant other Name of Psychiatrist: None Name of Therapist: None  Education Status Is patient currently in school?: No Is the patient employed, unemployed or receiving disability?: Employed  Risk to self with the past 6 months Suicidal Ideation: No(Passive ideation) Has patient been a risk to self within the past 6 months prior to admission? : No Suicidal Intent: No Has patient had any suicidal intent within the past 6 months prior to admission? : No Is patient at risk for suicide?: No Suicidal Plan?: No Has patient had any suicidal plan within the past 6 months prior to admission? : No Access to Means: No What has been your use of drugs/alcohol within the last 12 months?: Denied Previous Attempts/Gestures: Yes How many times?: 1(at age 59) Triggers for Past Attempts: Other (Comment)(Overwhelmed with  graduation) Intentional Self Injurious Behavior: None Family Suicide History: Unknown Recent stressful life event(s): Loss (Comment)(Grandmother died) Persecutory voices/beliefs?: No Depression: Yes Depression Symptoms: Despondent, Insomnia, Tearfulness, Isolating, Fatigue, Guilt, Loss of interest in usual pleasures, Feeling worthless/self pity Substance abuse history and/or treatment for substance abuse?: No Suicide prevention information given to non-admitted patients: Not applicable  Risk to Others within the past 6 months Homicidal Ideation: No Does patient have any lifetime risk of violence toward others beyond the six months prior to admission? : No Thoughts of Harm to Others: No Current Homicidal Intent: No Current Homicidal Plan: No Access to Homicidal Means: No History of harm to others?: No Assessment of Violence: None Noted Does patient have access to weapons?: No Criminal Charges Pending?: No Does patient have a court date: No Is patient on probation?: No  Psychosis Hallucinations: None noted Delusions: None noted  Mental Status Report Appearance/Hygiene: Unremarkable, In hospital gown Eye Contact: Good Motor Activity: Freedom of movement, Unremarkable Speech: Logical/coherent Level of Consciousness: Alert Mood: Sad, Anxious Affect: Appropriate to circumstance Anxiety Level: Panic Attacks Panic attack frequency: 1 Most recent panic attack: 11/05/2019 Thought Processes: Coherent, Relevant Judgement: Partial Orientation: Person, Place, Time, Situation Obsessive Compulsive Thoughts/Behaviors: None  Cognitive Functioning Concentration: Normal Memory: Recent Intact, Remote Intact Is patient IDD: No Insight: Fair Impulse Control: Fair Appetite: Poor Have you had any weight changes? : No Change Sleep: Decreased Total Hours of Sleep: 4 Vegetative Symptoms: None  ADLScreening (  [facility] Assessment Services) Patient's cognitive ability adequate to safely complete  daily activities?: Yes Patient able to express need for assistance with ADLs?: Yes Independently performs ADLs?: Yes (appropriate for developmental age)  Prior Inpatient Therapy Prior Inpatient Therapy: No  Prior Outpatient Therapy Prior Outpatient Therapy: Yes Prior Therapy Dates: 2016 Prior Therapy Facilty/Provider(s): outpatient psychiatrist Reason for Treatment: depression Does patient have an ACCT team?: No Does patient have Intensive In-House Services?  : No Does patient have [facility] services? : No Does patient have P4CC services?: No  ADL Screening (condition at time of admission) Patient's cognitive ability adequate to safely complete daily activities?: Yes Is the patient deaf or have difficulty hearing?: No Does the patient have difficulty seeing, even when wearing glasses/contacts?: No Does the patient have difficulty concentrating, remembering, or making decisions?: No Patient able to express need for assistance with ADLs?: Yes Does the patient have difficulty dressing or bathing?: No Independently performs ADLs?: Yes (appropriate for developmental age) Does the patient have difficulty walking or climbing stairs?: No Weakness of Legs: None Weakness of Arms/Hands: None  Home Assistive Devices/Equipment Home Assistive Devices/Equipment: None  Therapy Consults (therapy consults require a physician order) PT Evaluation Needed: No OT Evalulation Needed: No SLP Evaluation Needed: No Abuse/Neglect Assessment (Assessment to be complete while patient is alone) Abuse/Neglect Assessment Can Be Completed: Yes Physical Abuse: Denies Verbal Abuse: Denies Sexual Abuse: Denies Exploitation of patient/patient's resources: Denies Self-Neglect: Denies Values / Beliefs Cultural Requests During Hospitalization: None Spiritual Requests During Hospitalization: None Consults Spiritual Care Consult Needed: No Transition of Care Team Consult Needed: No Advance Directives (For  Healthcare) Does Patient Have a Medical Advance Directive?: No Would patient like information on creating a medical advance directive?: No - Patient declined          Disposition:  Disposition Initial Assessment Completed for this Encounter: Yes Disposition of Patient: Discharge with outpatient resources per L. Marcello Moores, [place].  This service was provided via telemedicine using a 2-way, interactive audio and video technology.  Names of all persons participating in this telemedicine service and their role in this encounter. Name: Jill Schwartz Role: Patient  Name: Jill Schwartz Role: Patient's mother          Marlowe Aschoff 11/05/2019 11:38 AM

## 2019-11-05 NOTE — ED Notes (Signed)
TTS at bedside. 

## 2019-11-05 NOTE — Discharge Instructions (Signed)
Please refer to the resources given to you today.  Please follow-up with someone regarding your anxiety and depression.  Please return to the ER immediately for new or worsening symptoms or concerns, such as new or worsening symptoms or any concerns at all.

## 2019-11-05 NOTE — ED Notes (Signed)
ED Provider at bedside. 

## 2019-11-05 NOTE — ED Triage Notes (Signed)
Pt states that she has been "feeling funny for 2 weeks". States "there are times when I get scared and today I got scared out of nowhere". Pt states that she has not taken her ADHD medication for 2 weeks. Is not on anxiety medication. Pt states she was lying down looking at phone with this anxiety attack occurred. Pt states that she is feeling better now.

## 2019-11-05 NOTE — ED Provider Notes (Signed)
[facility] EMERGENCY DEPARTMENT Provider Note   CSN: [id number] Arrival date & time: 11/05/19  8916     History Chief Complaint  Patient presents with  . Panic Attack    Jill Schwartz is a 23 y.o. female.  HPI     23 year old female, with a PMH of ADD, depression, presents with suicidal ideations.  Initially patient stated that she has been having anxiety attacks and an overwhelming sense of doom.  However on triage questioning she did note suicidal ideations for the last 2 weeks.  She denies a plan or access to weapons at home.  She denies previous suicide attempts.  Of note patient has not been taking her ADD medication, Vyvanse, due to cost.  She has been out of this medication for months.  Patient states she lost her grandmother approximately month ago.  Then last week she has had thoughts of "what if I just end it all."  Patient states when she gets an overwhelming sense of doom she does note associated chest pain and left arm numbness.  She states she had an episode like this earlier today.  Her chest pain and arm numbness have significantly improved and are almost resolved since onset of symptoms.  Patient denies any shortness of breath, nausea, vomiting, fevers, chills.  Past Medical History:  Diagnosis Date  . Attention deficit disorder   . Family history of adverse reaction to anesthesia    Family history of malignant hyperthermia  . Macromastia    Bilateral  . Shoulder dislocation     There are no problems to display for this patient.   Past Surgical History:  Procedure Laterality Date  . BREAST REDUCTION SURGERY Bilateral 03/01/2018   Procedure: BILATERAL MAMMARY REDUCTION  (BREAST);  Surgeon: Glenna Fellows, MD;  Location: [facility] OR;  Service: Plastics;  Laterality: Bilateral;  . BUTTOCK LIFT  08/2019  . SHOULDER SURGERY Right      OB History   No obstetric history on file.     History reviewed. No pertinent family history.  Social History    Tobacco Use  . Smoking status: Never Smoker  . Smokeless tobacco: Never Used  Substance Use Topics  . Alcohol use: No  . Drug use: No    Home Medications Prior to Admission medications   Medication Sig Start Date End Date Taking? Authorizing Provider  ibuprofen (ADVIL,MOTRIN) 800 MG tablet Take 1 tablet (800 mg total) by mouth 3 (three) times daily. Patient not taking: Reported on 02/25/2018 07/13/17   Rolland Porter, MD  medroxyPROGESTERone (PROVERA) 5 MG tablet Take 5 mg by mouth daily.    [provider]    Allergies    Penicillins  Review of Systems   Review of Systems  Constitutional: Negative for chills and fever.  Respiratory: Negative for shortness of breath.   Cardiovascular: Positive for chest pain.  Gastrointestinal: Negative for abdominal pain, nausea and vomiting.  Neurological: Positive for numbness.  Psychiatric/Behavioral: Positive for suicidal ideas. The patient is nervous/anxious.     Physical Exam Updated Vital Signs BP (!) 129/91 (BP Location: Right Arm)   Pulse 81   Temp 98.6 F (37 C) (Oral)   Resp 12   Ht 5\' 5"  (1.651 m)   Wt 82.6 kg   LMP 09/23/2019   SpO2 100%   BMI 30.29 kg/m   Physical Exam Vitals and nursing note reviewed.  Constitutional:      Appearance: She is well-developed.  HENT:     Head: Normocephalic  and atraumatic.  Eyes:     Conjunctiva/sclera: Conjunctivae normal.  Cardiovascular:     Rate and Rhythm: Regular rhythm. Tachycardia present.     Heart sounds: Normal heart sounds. No murmur.  Pulmonary:     Effort: Pulmonary effort is normal. No respiratory distress.     Breath sounds: Normal breath sounds. No wheezing or rales.  Abdominal:     General: Bowel sounds are normal. There is no distension.     Palpations: Abdomen is soft.     Tenderness: There is no abdominal tenderness.  Musculoskeletal:        General: No tenderness or deformity. Normal range of motion.     Cervical back: Neck supple.  Skin:     General: Skin is warm and dry.     Findings: No erythema or rash.  Neurological:     Mental Status: She is alert and oriented to person, place, and time.  Psychiatric:        Attention and Perception: Attention normal.        Mood and Affect: Mood is depressed. Affect is tearful.        Speech: Speech normal.        Behavior: Behavior normal.        Thought Content: Thought content includes suicidal ideation.     ED Results / Procedures / Treatments   Labs (all labs ordered are listed, but only abnormal results are displayed) Labs Reviewed  COMPREHENSIVE METABOLIC PANEL - Abnormal; Notable for the following components:      Result Value   Glucose, Bld 101 (*)    All other components within normal limits  RESPIRATORY PANEL BY RT PCR (FLU A&B, COVID)  ETHANOL  RAPID URINE DRUG SCREEN, HOSP PERFORMED  CBC WITH DIFFERENTIAL/PLATELET  PREGNANCY, URINE  URINALYSIS, ROUTINE W REFLEX MICROSCOPIC    EKG EKG Interpretation  Date/Time:  Wednesday November 05 2019 10:41:48 EST Ventricular Rate:  98 PR Interval:    QRS Duration: 88 QT Interval:  359 QTC Calculation: 459 R Axis:   53 Text Interpretation: Sinus rhythm Borderline T wave abnormalities Baseline wander in lead(s) V3 V4 V5 No STEMI Confirmed by Alvester Chourifan, Matthew [phone]([id number]) on 11/05/2019 10:57:23 AM   Radiology No results found.  Procedures Procedures (including critical care time)  Medications Ordered in ED Medications  hydrOXYzine (ATARAX/VISTARIL) tablet 25 mg (25 mg Oral Given 11/05/19 1138)    ED Course  I have reviewed the triage vital signs and the nursing notes.  Pertinent labs & imaging results that were available during my care of the patient were reviewed by me and considered in my medical decision making (see chart for details).    MDM Rules/Calculators/A&P                      Patient presents with suicidal ideations and panic attacks.  Patient is very reasonable, calm and cooperative.  She denies any  suicidal plan.  Her blood work is unremarkable.  Her urine shows no signs of infection.  Her UDS is negative.  Urine pregnancy is negative.  She had an EKG which shows normal sinus rhythm, no acute ST changes.  She was given Vistaril and is feeling better.  She was seen and evaluated by TTS.  They recommend discharge with outpatient resources.  They called and discussed with patient's mother who was agreeable with plan.  I have evaluated patient and discussed possible discharge.  She feels comfortable with this.  I will discharge  with some Vistaril.  However she was given strict return precautions and she expressed understanding.  She is ready and stable for discharge.    At this time there does not appear to be any evidence of an acute emergency medical condition and the patient appears stable for discharge with appropriate outpatient follow up.Diagnosis was discussed with patient who verbalizes understanding and is agreeable to discharge.    Final Clinical Impression(s) / ED Diagnoses Final diagnoses:  None    Rx / DC Orders ED Discharge Orders    None       Rachel Moulds 11/05/19 1806    Wyvonnia Dusky, MD 11/05/19 Curly Rim

## 2019-11-05 NOTE — ED Notes (Signed)
[facility] NP reports he would like to speak with pt mother, pt agrees, and gives consent to [facility] to speak with Nelia Shi - Mother.

## 2021-04-22 ENCOUNTER — Emergency Department (HOSPITAL_BASED_OUTPATIENT_CLINIC_OR_DEPARTMENT_OTHER)
Admission: EM | Admit: 2021-04-22 | Discharge: 2021-04-22 | Disposition: A | Discharge status: Home / Self Care | Payer: No Typology Code available for payment source | Attending: Emergency Medicine | Admitting: Emergency Medicine

## 2021-04-22 ENCOUNTER — Encounter (HOSPITAL_BASED_OUTPATIENT_CLINIC_OR_DEPARTMENT_OTHER): Payer: Self-pay | Admitting: *Deleted

## 2021-04-22 ENCOUNTER — Other Ambulatory Visit: Payer: Self-pay

## 2021-04-22 DIAGNOSIS — F314 Bipolar disorder, current episode depressed, severe, without psychotic features: Secondary | ICD-10-CM | POA: Insufficient documentation

## 2021-04-22 DIAGNOSIS — F411 Generalized anxiety disorder: Secondary | ICD-10-CM | POA: Diagnosis not present

## 2021-04-22 DIAGNOSIS — F32A Depression, unspecified: Secondary | ICD-10-CM | POA: Diagnosis present

## 2021-04-22 DIAGNOSIS — Z20822 Contact with and (suspected) exposure to covid-19: Secondary | ICD-10-CM | POA: Insufficient documentation

## 2021-04-22 DIAGNOSIS — R45851 Suicidal ideations: Secondary | ICD-10-CM

## 2021-04-22 DIAGNOSIS — R45850 Homicidal ideations: Secondary | ICD-10-CM | POA: Diagnosis not present

## 2021-04-22 HISTORY — DX: Depression, unspecified: F32.A

## 2021-04-22 LAB — RAPID URINE DRUG SCREEN, HOSP PERFORMED
Amphetamines: NOT DETECTED
Barbiturates: NOT DETECTED
Benzodiazepines: NOT DETECTED
Cocaine: NOT DETECTED
Opiates: NOT DETECTED
Tetrahydrocannabinol: NOT DETECTED

## 2021-04-22 LAB — RESP PANEL BY RT-PCR (FLU A&B, COVID) ARPGX2
Influenza A by PCR: NEGATIVE
Influenza B by PCR: NEGATIVE
SARS Coronavirus 2 by RT PCR: NEGATIVE

## 2021-04-22 LAB — COMPREHENSIVE METABOLIC PANEL
ALT: 26 U/L (ref 0–44)
AST: 25 U/L (ref 15–41)
Albumin: 4.4 g/dL (ref 3.5–5.0)
Alkaline Phosphatase: 48 U/L (ref 38–126)
Anion gap: 7 (ref 5–15)
BUN: 11 mg/dL (ref 6–20)
CO2: 26 mmol/L (ref 22–32)
Calcium: 9.1 mg/dL (ref 8.9–10.3)
Chloride: 105 mmol/L (ref 98–111)
Creatinine, Ser: 0.97 mg/dL (ref 0.44–1.00)
GFR, Estimated: >60 mL/min (ref >60)
Glucose, Bld: 135 mg/dL — ABNORMAL HIGH (ref 70–99)
Potassium: 3.7 mmol/L (ref 3.5–5.1)
Sodium: 138 mmol/L (ref 135–145)
Total Bilirubin: 0.2 mg/dL — ABNORMAL LOW (ref 0.3–1.2)
Total Protein: 7.4 g/dL (ref 6.5–8.1)

## 2021-04-22 LAB — ACETAMINOPHEN LEVEL: Acetaminophen (Tylenol), Serum: <10 ug/mL — ABNORMAL LOW (ref 10–30)

## 2021-04-22 LAB — CBC
HCT: 40.2 % (ref 36.0–46.0)
Hemoglobin: 13.7 g/dL (ref 12.0–15.0)
MCH: 28.8 pg (ref 26.0–34.0)
MCHC: 34.1 g/dL (ref 30.0–36.0)
MCV: 84.6 fL (ref 80.0–100.0)
Platelets: 388 10*3/uL (ref 150–400)
RBC: 4.75 MIL/uL (ref 3.87–5.11)
RDW: 13 % (ref 11.5–15.5)
WBC: 5.7 10*3/uL (ref 4.0–10.5)
nRBC: 0 % (ref 0.0–0.2)

## 2021-04-22 LAB — SALICYLATE LEVEL: Salicylate Lvl: <7 mg/dL — ABNORMAL LOW (ref 7.0–30.0)

## 2021-04-22 LAB — ETHANOL: Alcohol, Ethyl (B): <10 mg/dL (ref <10)

## 2021-04-22 LAB — PREGNANCY, URINE: Preg Test, Ur: NEGATIVE

## 2021-04-22 MED ORDER — HYDROXYZINE HCL 25 MG PO TABS
25.0000 mg | ORAL_TABLET | Freq: Once | ORAL | Status: AC
  Administered 2021-04-22: 25 mg via ORAL
  Filled 2021-04-22: qty 1

## 2021-04-22 NOTE — ED Notes (Signed)
Pt belongings placed in two bags and locked in cabinet

## 2021-04-22 NOTE — ED Notes (Signed)
Pt. In no distress with no s/s of distress noted.  Pt. Has no suicidal thoughts or ideations.

## 2021-04-22 NOTE — ED Notes (Signed)
Pt. Mother here at [facility] asking to see the Pt.  Explained that the Pt. Can have no visitors.

## 2021-04-22 NOTE — ED Notes (Addendum)
Sitter called for   None available

## 2021-04-22 NOTE — ED Notes (Signed)
Pt. Grandmother called to check on the Pt.   Pt. Is speaking to the TTS evaluator at this time.  Ala Dach

## 2021-04-22 NOTE — ED Provider Notes (Signed)
[facility] EMERGENCY DEPARTMENT Provider Note   CSN: [id number] Arrival date & time: 04/22/21  1212     History Chief Complaint  Patient presents with   Suicidal    Jill Schwartz is a 25 y.o. female.  25 y.o female with  a PMH of Depression, ADHD presents to the ED with a chief complaint of suicidal ideations times a few days.  Patient reports a prior attempt in the past where she took some Vyvanse along with Benadryl in order to hurt herself.  On today's visit, she reports she has been under a lot of stress while at work, she is currently employed at [employer].  She states she has a plan to hurt her self, however unsure how to do this at this time.  Was started on medication about 3 weeks ago Abilify by her PCP, states that she has been hearing voices, she is "unable to make out what they say ".  She is currently not taking any of that medication at this point.  Denies any homicidal ideations, visual hallucinations, or other complaints.  The history is provided by the patient.      Past Medical History:  Diagnosis Date   Attention deficit disorder    Depression    Family history of adverse reaction to anesthesia    Family history of malignant hyperthermia   Macromastia    Bilateral   Shoulder dislocation     There are no problems to display for this patient.   Past Surgical History:  Procedure Laterality Date   BREAST REDUCTION SURGERY Bilateral 03/01/2018   Procedure: BILATERAL MAMMARY REDUCTION  (BREAST);  Surgeon: Glenna Fellows, MD;  Location: [facility] OR;  Service: Plastics;  Laterality: Bilateral;   BUTTOCK LIFT  08/2019   SHOULDER SURGERY Right      OB History   No obstetric history on file.     No family history on file.  Social History   Tobacco Use   Smoking status: Never   Smokeless tobacco: Never  Vaping Use   Vaping Use: Never used  Substance Use Topics   Alcohol use: No   Drug use: No    Home Medications Prior to Admission  medications   Medication Sig Start Date End Date Taking? Authorizing Provider  hydrOXYzine (ATARAX/VISTARIL) 25 MG tablet Take 1 tablet (25 mg total) by mouth every 8 (eight) hours as needed for anxiety. 11/05/19   Kendrick, Caitlyn S, PA-C  ibuprofen (ADVIL,MOTRIN) 800 MG tablet Take 1 tablet (800 mg total) by mouth 3 (three) times daily. Patient not taking: No sig reported 07/13/17   Rolland Porter, MD  medroxyPROGESTERone (PROVERA) 5 MG tablet Take 5 mg by mouth daily.    [provider]    Allergies    Penicillins  Review of Systems   Review of Systems  Constitutional:  Negative for chills and fever.  HENT:  Negative for ear pain and sore throat.   Eyes:  Negative for pain and visual disturbance.  Respiratory:  Negative for cough and shortness of breath.   Cardiovascular:  Negative for chest pain and palpitations.  Gastrointestinal:  Negative for abdominal pain and vomiting.  Genitourinary:  Negative for dysuria and hematuria.  Musculoskeletal:  Negative for arthralgias and back pain.  Skin:  Negative for color change and rash.  Neurological:  Negative for seizures and syncope.  Psychiatric/Behavioral:  Positive for suicidal ideas. Negative for hallucinations and self-injury. The patient is not nervous/anxious.   All other systems reviewed and  are negative.  Physical Exam Updated Vital Signs BP 121/90 (BP Location: Right Arm)   Pulse 90   Temp 98.7 F (37.1 C) (Oral)   Resp 18   Ht 5\' 5"  (1.651 m)   Wt 88 kg   SpO2 100%   BMI 32.28 kg/m   Physical Exam Vitals and nursing note reviewed.  Constitutional:      General: She is not in acute distress.    Appearance: She is well-developed.  HENT:     Head: Normocephalic and atraumatic.     Mouth/Throat:     Pharynx: No oropharyngeal exudate.  Eyes:     Pupils: Pupils are equal, round, and reactive to light.  Cardiovascular:     Rate and Rhythm: Regular rhythm.     Heart sounds: Normal heart sounds.  Pulmonary:      Effort: Pulmonary effort is normal. No respiratory distress.     Breath sounds: Normal breath sounds.  Abdominal:     General: Bowel sounds are normal. There is no distension.     Palpations: Abdomen is soft.     Tenderness: There is no abdominal tenderness.  Musculoskeletal:        General: No tenderness or deformity.     Cervical back: Normal range of motion.     Right lower leg: No edema.     Left lower leg: No edema.  Skin:    General: Skin is warm and dry.  Neurological:     Mental Status: She is alert and oriented to person, place, and time.  Psychiatric:        Attention and Perception: Attention normal.        Mood and Affect: Affect is blunt.        Speech: Speech normal.        Behavior: Behavior is slowed and withdrawn.        Thought Content: Thought content includes suicidal ideation.    ED Results / Procedures / Treatments   Labs (all labs ordered are listed, but only abnormal results are displayed) Labs Reviewed  COMPREHENSIVE METABOLIC PANEL - Abnormal; Notable for the following components:      Result Value   Glucose, Bld 135 (*)    Total Bilirubin 0.2 (*)    All other components within normal limits  SALICYLATE LEVEL - Abnormal; Notable for the following components:   Salicylate Lvl <7.0 (*)    All other components within normal limits  ACETAMINOPHEN LEVEL - Abnormal; Notable for the following components:   Acetaminophen (Tylenol), Serum <10 (*)    All other components within normal limits  RESP PANEL BY RT-PCR (FLU A&B, COVID) ARPGX2  ETHANOL  CBC  RAPID URINE DRUG SCREEN, HOSP PERFORMED  PREGNANCY, URINE    EKG None  Radiology No results found.  Procedures Procedures   Medications Ordered in ED Medications  hydrOXYzine (ATARAX/VISTARIL) tablet 25 mg (25 mg Oral Given 04/22/21 1800)    ED Course  I have reviewed the triage vital signs and the nursing notes.  Pertinent labs & imaging results that were available during my care of the  patient were reviewed by me and considered in my medical decision making (see chart for details).    MDM Rules/Calculators/A&P     Presents to the ED with suicidal ideations, states she had a prior attempt 6 years ago.  Reports being under a lot of stress while at work.  Recent change in medications about 3 weeks ago by PCP consistent with Abilify  which she discontinued taking in about 1 week ago.  Also reports auditory hallucinations, she is unsure what to make of these words.  Vitals on arrival are within normal limits, slight elevation of her heart rate at 108.  During primary evaluation, she is overall nontoxic, non-ill-appearing.  Moves all upper and lower extremities, no chest pain, no shortness of breath, no bilateral calf tenderness.  He is somewhat withdrawn, she is slow to respond, does endorse suicidal ideations along with auditory hallucinations.  Medical clearance labs have been ordered.  Interpretation of her lab work reveal a CBC without leukocytosis, hemoglobin is within normal limits.  CMP without any electrolyte derangement, glucose is slightly elevated.  Creatinine levels within normal limits her LFTs are unremarkable.  Salicylate, acetaminophen level, ethanol level are negative.  COVID, influenza AMB are also negative.  Patient is medically clear for psychiatric evaluation.  TTS consult has been ordered.  Patient given atarax for anxiety according to chart review, she was prescribed this in the past.  Mother is outside in waiting room trying to speak to patient, however patient does not want to speak to her mother at this time.  She does report wanting to call her grandma, which we discussed she may be able to do after being assessed by psychiatry.  Patient remains voluntary at this time.  6:03 PM Spoke to Onalee Hua counselor who reports patient is likely to be TTS in the next hour.   7:10 PM Spoke to [facility] who reports shift change is happening at this moment, patient is in  line to be TTS.   Portions of this note were generated with Scientist, clinical (histocompatibility and immunogenetics). Dictation errors may occur despite best attempts at proofreading.  Final Clinical Impression(s) / ED Diagnoses Final diagnoses:  Suicidal ideation    Rx / DC Orders ED Discharge Orders     None        Claude Manges, Cordelia Poche 04/22/21 1913    Terrilee Files, MD 04/22/21 [phone]

## 2021-04-22 NOTE — ED Notes (Signed)
Spoke with Pt. Grandmother about Pt going home with her and feeling safe with situation. Pt. Grandmother feels completely safe with situation.  Pt. To be released to the grandmother.

## 2021-04-22 NOTE — BH Assessment (Signed)
Comprehensive Clinical Assessment (CCA) Note  04/22/2021 Jill Schwartz [id number]  DISPOSITION: Gave clinical report to Jill Bering, NP who determined Pt does not meet criteria for inpatient psychiatric treatment and is psychiatrically cleared for discharge. Pt's grandmother, Jill Schwartz, agrees that Pt can stay with her. Pt contracts for safety and says she will keep her appointment with her psychiatrist, Dr. Marcelo Schwartz, on Monday and will contact her therapist for the earliest available appointment. Notified Jill Manges, PA-C and Jill Backer, RN of recommendation via secure chat.  The patient demonstrates the following risk factors for suicide: Chronic risk factors for suicide include: psychiatric disorder of bipolar disorder and GAD and previous suicide attempts in 2016 by overdose . Acute risk factors for suicide include: family or marital conflict. Protective factors for this patient include: positive social support, positive therapeutic relationship, responsibility to others (children, family), coping skills, and hope for the future. Considering these factors, the overall suicide risk at this point appears to be low. Patient is appropriate for outpatient follow up.  Flowsheet Row ED from 04/22/2021 in [facility] ED from 11/05/2019 in [facility]  C-SSRS RISK CATEGORY Moderate Risk Low Risk      Pt is a 25 year old single female who presents unaccompanied to [facility] reporting symptoms of depression and anxiety. Pt says approximately three weeks ago her psychiatrist prescribed Abilify but discontinued it one week ago because it appeared to be causing auditory hallucinations of hearing people mumbling words Pt could not understand. Pt says since Abilify was discontinued the auditory hallucinations have ceased but "I've been more down than usual." She says today she became very emotional because she had an argument  with her mother and she went on a job interview and the people were rude. She says she tried to contact her grandmother for support but her grandmother was working. Pt says she began having suicidal thoughts and came to ED.  Pt describes her mood as anxious. Pt acknowledges symptoms including crying spells, racing thoughts, irritability, decreased concentration, increased appetite and panic attacks. She reports suicidal thoughts earlier today with no plan or intent. She denies current suicidal thoughts. Pt reports she attempted suicide in 2016 by overdosing on medication following an argument with her mother. She denies NSSIB. She denies current auditory or visual hallucinations. She denies alcohol or other substance use.  Pt identifies several stressors. She says she works for [employer] and that her job is very stressful. Pt says she has taken a leave of absence and is looking for another job. She reports she is having financial difficulties. She says she feels she always has to please her mother. Pt reports she lives with her significant other. She identifies her grandmother as her primary support. Pt says there is a maternal family history of schizophrenia. Pt denies history of abuse or trauma. She denies legal problems. She denies access to firearms.  Pt states she is seeing psychiatrist Jill Baldy, MD with [facility] and her next appointment is 04/25/2021. She says her therapist is "Jill Schwartz" with [facility]. Pt says she was psychiatrically hospitalized at [facility] in 2016 following a suicide attempt by overdose.  Pt states she wants to be discharged, stay with her grandmother, and follow up with her current mental health providers. Pt gave permission to speak with her grandmother. TTS contacted Jill Schwartz at [phone]. She says she and Pt are very close and she knew after  she left work that Pt was having a difficult day. She says she wants Pt to  come and stay with her. She says she has no concerns for Pt's safety if she is discharged.  Pt is dressed in hospital scrubs, alert and oriented x4. Pt speaks in a clear tone, at moderate volume and normal pace. Motor behavior appears normal. Eye contact is good. Pt's mood is anxious and affect is congruent with mood. Thought process is coherent and relevant. There is no indication Pt is currently responding to internal stimuli or experiencing delusional thought content. Pt was pleasant and cooperative throughout assessment. She says she does not want to be admitted to a psychiatric facility.   Chief Complaint:  Chief Complaint  Patient presents with   Suicidal   Visit Diagnosis: F31.4 Bipolar I disorder, Current or most recent episode depressed, Severe F41.1 Generalized anxiety disorder   CCA Screening, Triage and Referral (STR)  Patient Reported Information How did you hear about [place]? Self  Referral name: No data recorded Referral phone number: No data recorded  Whom do you see for routine medical problems? No data recorded Practice/Facility Name: No data recorded Practice/Facility Phone Number: No data recorded Name of Contact: No data recorded Contact Number: No data recorded Contact Fax Number: No data recorded Prescriber Name: No data recorded Prescriber Address (if known): No data recorded  What Is the Reason for Your Visit/Call Today? Pt reports she felt very anxious and emotional today due to stressors. She reports suicidal thoughts today with no plan or intent.  How Long Has This Been Causing You Problems? > than 6 months  What Do You Feel Would Help You the Most Today? Treatment for Depression or other mood problem   Have You Recently Been in Any Inpatient Treatment (Hospital/Detox/Crisis Center/28-Day Program)? No data recorded Name/Location of Program/Hospital:No data recorded How Long Were You There? No data recorded When Were You Discharged? No data  recorded  Have You Ever Received Services From [facility] Before? No data recorded Who Do You See at [facility]? No data recorded  Have You Recently Had Any Thoughts About Hurting Yourself? Yes  Are You Planning to Commit Suicide/Harm Yourself At This time? No   Have you Recently Had Thoughts About Hurting Someone Karolee Ohs? No  Explanation: No data recorded  Have You Used Any Alcohol or Drugs in the Past 24 Hours? No  How Long Ago Did You Use Drugs or Alcohol? No data recorded What Did You Use and How Much? No data recorded  Do You Currently Have a Therapist/Psychiatrist? Yes  Name of Therapist/Psychiatrist: Marcelo Baldy, MD with [facility]. "Jill Schwartz" with [facility].   Have You Been Recently Discharged From Any Office Practice or Programs? No  Explanation of Discharge From Practice/Program: No data recorded    CCA Screening Triage Referral Assessment Type of Contact: Tele-Assessment  Is this Initial or Reassessment? Initial Assessment  Date Telepsych consult ordered in [facility]:  04/22/21  Time Telepsych consult ordered in [facility]:  1404   Patient Reported Information Reviewed? No data recorded Patient Left Without Being Seen? No data recorded Reason for Not Completing Assessment: No data recorded  Collateral Involvement: Grandmother: Jill Schwartz [phone].   Does Patient Have a Automotive engineer Guardian? No data recorded Name and Contact of Legal Guardian: No data recorded If Minor and Not Living with Parent(s), Who has Custody? NA  Is CPS involved or ever been involved? Never  Is APS involved or ever been involved? Never  Patient Determined To Be At Risk for Harm To Self or Others Based on Review of Patient Reported Information or Presenting Complaint? No  Method: No data recorded Availability of Means: No data recorded Intent: No data recorded Notification Required: No data recorded Additional Information for Danger to Others Potential:  No data recorded Additional Comments for Danger to Others Potential: No data recorded Are There Guns or Other Weapons in Your Home? No data recorded Types of Guns/Weapons: No data recorded Are These Weapons Safely Secured?                            No data recorded Who Could Verify You Are Able To Have These Secured: No data recorded Do You Have any Outstanding Charges, Pending Court Dates, Parole/Probation? No data recorded Contacted To Inform of Risk of Harm To Self or Others: Family/Significant Other:   Location of Assessment: [facility]   Does Patient Present under Involuntary Commitment? No  IVC Papers Initial File Date: No data recorded  [place]   Patient Currently Receiving the Following Services: Individual Therapy; Medication Management   Determination of Need: Urgent (48 hours)   Options For Referral: Outpatient Therapy; Medication Management; BH Urgent Care     CCA Biopsychosocial Intake/Chief Complaint:  No data recorded Current Symptoms/Problems: No data recorded  Patient Reported Schizophrenia/Schizoaffective Diagnosis in Past: No   Strengths: Pt is motivated for treatment and has good family support.  Preferences: No data recorded Abilities: No data recorded  Type of Services Patient Feels are Needed: No data recorded  Initial Clinical Notes/Concerns: No data recorded  Mental Health Symptoms Depression:   Change in energy/activity; Difficulty Concentrating; Increase/decrease in appetite; Irritability; Tearfulness   Duration of Depressive symptoms:  Greater than two weeks   Mania:   Racing thoughts; Change in energy/activity; Irritability   Anxiety:    Difficulty concentrating; Irritability; Tension; Worrying   Psychosis:   Hallucinations (Denies current hallucinations.)   Duration of Psychotic symptoms:  Greater than six months   Trauma:   None   Obsessions:   None   Compulsions:   None    Inattention:   N/A   Hyperactivity/Impulsivity:   N/A   Oppositional/Defiant Behaviors:   N/A   Emotional Irregularity:   None   Other Mood/Personality Symptoms:   NA    Mental Status Exam Appearance and self-care  Stature:   Average   Weight:   Overweight   Clothing:   -- (Scrubs)   Grooming:   Well-groomed   Cosmetic use:   Age appropriate   Posture/gait:   Normal   Motor activity:   Not Remarkable   Sensorium  Attention:   Normal   Concentration:   Normal   Orientation:   X5   Recall/memory:   Normal   Affect and Mood  Affect:   Anxious   Mood:   Anxious   Relating  Eye contact:   Normal   Facial expression:   Anxious; Responsive   Attitude toward examiner:   Cooperative   Thought and Language  Speech flow:  Clear and Coherent   Thought content:   Appropriate to Mood and Circumstances   Preoccupation:   None   Hallucinations:   None   Organization:  No data recorded  Affiliated Computer ServicesExecutive Functions  Fund of Knowledge:   Average   Intelligence:   Average   Abstraction:   Normal   Judgement:   Normal  Reality Testing:   Realistic   Insight:   Uses connections   Decision Making:   Normal   Social Functioning  Social Maturity:   Responsible   Social Judgement:   Normal   Stress  Stressors:   Family conflict; Financial; Work   Coping Ability:   Human resources officer Deficits:   None   Supports:   Family     Religion: Religion/Spirituality Are You A Religious Person?: Yes What is Your Religious Affiliation?: Christian How Might This Affect Treatment?: NA  Leisure/Recreation: Leisure / Recreation Do You Have Hobbies?: Yes Leisure and Hobbies: Enjoys reading  Exercise/Diet: Exercise/Diet Do You Exercise?: No Have You Gained or Lost A Significant Amount of Weight in the Past Six Months?: Yes-Gained Number of Pounds Gained: 5 Do You Follow a Special Diet?: No Do You Have Any Trouble  Sleeping?: No (Sleeps 8-10 hours with Trazodone.)   CCA Employment/Education Employment/Work Situation: Employment / Work Situation Employment Situation: Employed Work Stressors: Pt reports her job at [employer] is stressful Patient's Job has Been Impacted by Current Illness: Yes Describe how Patient's Job has Been Impacted: Pt is currently on leave due to depression. Has Patient ever Been in the [employer]?: No  Education: Education Is Patient Currently Attending School?: No Last Grade Completed: 12 Did You Attend College?: No Did You Have An Individualized Education Program (IIEP): No Did You Have Any Difficulty At School?: No Patient's Education Has Been Impacted by Current Illness: No   CCA Family/Childhood History Family and Relationship History: Family history Marital status: Single Does patient have children?: No  Childhood History:  Childhood History By whom was/is the patient raised?: Mother, Grandparents Did patient suffer any verbal/emotional/physical/sexual abuse as a child?: No Did patient suffer from severe childhood neglect?: No Has patient ever been sexually abused/assaulted/raped as an adolescent or adult?: No Was the patient ever a victim of a crime or a disaster?: No Witnessed domestic violence?: No Has patient been affected by domestic violence as an adult?: No  Child/Adolescent Assessment:     CCA Substance Use Alcohol/Drug Use: Alcohol / Drug Use Pain Medications: See MAR Prescriptions: See MAR Over the Counter: See MAR History of alcohol / drug use?: No history of alcohol / drug abuse Longest period of sobriety (when/how long): NA                         ASAM's:  Six Dimensions of Multidimensional Assessment  Dimension 1:  Acute Intoxication and/or Withdrawal Potential:      Dimension 2:  Biomedical Conditions and Complications:      Dimension 3:  Emotional, Behavioral, or Cognitive Conditions and Complications:     Dimension 4:   Readiness to Change:     Dimension 5:  Relapse, Continued use, or Continued Problem Potential:     Dimension 6:  Recovery/Living Environment:     ASAM Severity Score:    ASAM Recommended Level of Treatment:     Substance use Disorder (SUD)    Recommendations for Services/Supports/Treatments:    DSM5 Diagnoses: There are no problems to display for this patient.   Patient Centered Plan: Patient is on the following Treatment Plan(s):  Anxiety and Depression   Referrals to Alternative Service(s): Referred to Alternative Service(s):   Place:   Date:   Time:    Referred to Alternative Service(s):   Place:   Date:   Time:    Referred to Alternative Service(s):   Place:   Date:  Time:    Referred to Alternative Service(s):   Place:   Date:   Time:     Evelena Peat, [facility]

## 2021-04-22 NOTE — ED Triage Notes (Signed)
States she is having SI. She plans to overdose on medication. Hx of suicide attempt with medication OD 6 years ago. She was hearing voices last week after her MD started her Abilify 3 weeks prior. She stopped taking the medication a week ago. She drove herself here.

## 2021-04-22 NOTE — ED Notes (Signed)
Pt. Asking for anxiety medication.

## 2021-04-22 NOTE — Discharge Instructions (Addendum)
Please follow-up with your psychiatrist on Monday as scheduled. Come back to the emergency department or [facility] if you feel like your symptoms are uncontrolled.

## 2021-11-13 NOTE — L&D Delivery Note (Addendum)
LABOR COURSE Patient presented for elective IOL and received cytotec and pitocin. She was AROMed and received an epidural.   Delivery Note Called to room and patient was complete and pushing. Head delivered. No nuchal cord present. Shoulder and body delivered in usual fashion. At  0220 a viable and healthy female was delivered via Vaginal, Spontaneous (Presentation: ROA).  Infant with spontaneous cry, placed on mother's abdomen, dried and stimulated. Cord clamped x 2 after 1-minute delay, and cut by FOB. Cord blood drawn. Placenta delivered spontaneously with gentle cord traction. Appears intact. Fundus firm with massage and Pitocin. Labia, perineum, vagina, and cervix inspected with bilateral periurethral, sulcal, and 2nd degree perineal lacerations that were repaired.    APGAR: 9, 9; weight  3270.   Cord: 3VC with the following complications:none    Anesthesia:  epidural Episiotomy: None Lacerations: bilateral periurethral, sulcal, and 2nd degree perineal lacerations Suture Repair: 3.0 monocryl and 3.0 vicryl  Est. Blood Loss (mL): 824  Mom to postpartum.  Baby to Couplet care / Skin to Skin.  Lesly Dukes, PGY-1 10/09/22 4:37 AM   ___ GME ATTESTATION:  Evaluation and management procedures were performed by the [facility] Medicine Resident under my supervision. I was immediately available for direct supervision, assistance and direction throughout this encounter.  I also confirm that I have verified the information documented in the resident's note, and that I have also personally reperformed the pertinent components of the physical exam and all of the medical decision making activities.  I have also made any necessary editorial changes.  Myrtie Hawk, DO OB Fellow, Faculty [facility] 10/09/2022 6:04 AM

## 2022-02-06 ENCOUNTER — Encounter (HOSPITAL_COMMUNITY): Payer: Self-pay | Admitting: Obstetrics and Gynecology

## 2022-02-06 ENCOUNTER — Inpatient Hospital Stay (HOSPITAL_COMMUNITY)
Admission: AD | Admit: 2022-02-06 | Discharge: 2022-02-06 | Disposition: A | Discharge status: Home / Self Care | Payer: No Typology Code available for payment source | Attending: Obstetrics and Gynecology | Admitting: Obstetrics and Gynecology

## 2022-02-06 ENCOUNTER — Other Ambulatory Visit: Payer: Self-pay

## 2022-02-06 ENCOUNTER — Inpatient Hospital Stay (HOSPITAL_COMMUNITY): Payer: No Typology Code available for payment source

## 2022-02-06 DIAGNOSIS — Z880 Allergy status to penicillin: Secondary | ICD-10-CM | POA: Diagnosis not present

## 2022-02-06 DIAGNOSIS — Z3A01 Less than 8 weeks gestation of pregnancy: Secondary | ICD-10-CM

## 2022-02-06 DIAGNOSIS — O26891 Other specified pregnancy related conditions, first trimester: Secondary | ICD-10-CM | POA: Diagnosis not present

## 2022-02-06 DIAGNOSIS — R1030 Lower abdominal pain, unspecified: Secondary | ICD-10-CM | POA: Diagnosis present

## 2022-02-06 DIAGNOSIS — R109 Unspecified abdominal pain: Secondary | ICD-10-CM

## 2022-02-06 DIAGNOSIS — O3680X0 Pregnancy with inconclusive fetal viability, not applicable or unspecified: Secondary | ICD-10-CM

## 2022-02-06 LAB — CBC WITH DIFFERENTIAL/PLATELET
Abs Immature Granulocytes: 0.02 10*3/uL (ref 0.00–0.07)
Basophils Absolute: 0 10*3/uL (ref 0.0–0.1)
Basophils Relative: 0 %
Eosinophils Absolute: 0.1 10*3/uL (ref 0.0–0.5)
Eosinophils Relative: 2 %
HCT: 38.3 % (ref 36.0–46.0)
Hemoglobin: 13 g/dL (ref 12.0–15.0)
Immature Granulocytes: 0 %
Lymphocytes Relative: 33 %
Lymphs Abs: 2.7 10*3/uL (ref 0.7–4.0)
MCH: 28.4 pg (ref 26.0–34.0)
MCHC: 33.9 g/dL (ref 30.0–36.0)
MCV: 83.8 fL (ref 80.0–100.0)
Monocytes Absolute: 0.6 10*3/uL (ref 0.1–1.0)
Monocytes Relative: 7 %
Neutro Abs: 4.7 10*3/uL (ref 1.7–7.7)
Neutrophils Relative %: 58 %
Platelets: 414 10*3/uL — ABNORMAL HIGH (ref 150–400)
RBC: 4.57 MIL/uL (ref 3.87–5.11)
RDW: 13.9 % (ref 11.5–15.5)
WBC: 8.2 10*3/uL (ref 4.0–10.5)
nRBC: 0 % (ref 0.0–0.2)

## 2022-02-06 LAB — WET PREP, GENITAL
Clue Cells Wet Prep HPF POC: NONE SEEN
Sperm: NONE SEEN
Trich, Wet Prep: NONE SEEN
WBC, Wet Prep HPF POC: <10 (ref <10)
Yeast Wet Prep HPF POC: NONE SEEN

## 2022-02-06 LAB — URINALYSIS, ROUTINE W REFLEX MICROSCOPIC
Bilirubin Urine: NEGATIVE
Glucose, UA: NEGATIVE mg/dL
Ketones, ur: 20 mg/dL — AB
Nitrite: NEGATIVE
Protein, ur: NEGATIVE mg/dL
Specific Gravity, Urine: 1.009 (ref 1.005–1.030)
pH: 5 (ref 5.0–8.0)

## 2022-02-06 LAB — COMPREHENSIVE METABOLIC PANEL
ALT: 18 U/L (ref 0–44)
AST: 18 U/L (ref 15–41)
Albumin: 4.2 g/dL (ref 3.5–5.0)
Alkaline Phosphatase: 46 U/L (ref 38–126)
Anion gap: 5 (ref 5–15)
BUN: 10 mg/dL (ref 6–20)
CO2: 24 mmol/L (ref 22–32)
Calcium: 9.4 mg/dL (ref 8.9–10.3)
Chloride: 109 mmol/L (ref 98–111)
Creatinine, Ser: 0.94 mg/dL (ref 0.44–1.00)
GFR, Estimated: >60 mL/min (ref >60)
Glucose, Bld: 107 mg/dL — ABNORMAL HIGH (ref 70–99)
Potassium: 3.7 mmol/L (ref 3.5–5.1)
Sodium: 138 mmol/L (ref 135–145)
Total Bilirubin: 0.2 mg/dL — ABNORMAL LOW (ref 0.3–1.2)
Total Protein: 7.3 g/dL (ref 6.5–8.1)

## 2022-02-06 LAB — ABO/RH: ABO/RH(D): A POS

## 2022-02-06 LAB — POCT PREGNANCY, URINE: Preg Test, Ur: POSITIVE — AB

## 2022-02-06 LAB — HCG, QUANTITATIVE, PREGNANCY: hCG, Beta Chain, Quant, S: 2265 m[IU]/mL — ABNORMAL HIGH (ref <5)

## 2022-02-06 NOTE — MAU Provider Note (Signed)
?History  ?  ? ?CSN: [id number] ? ?Arrival date and time: 02/06/22 1451 ? ? Event Date/Time  ? First Provider Initiated Contact with Patient 02/06/22 1618   ?  ? ?Chief Complaint  ?Patient presents with  ? Abdominal Pain  ? Vaginal Discharge  ? ?HPI ? ?[name] is 26 y.o. female G1P0 @ [redacted]w[redacted]d here in MAU with complaints of lower abdominal pain and vaginal discharge. The discharge is thin and white. She reports some lower abdominal pain that comes and goes. The pain is all across her abdomen. She has not taken anything for the pain. The pain is mild.  ? ?OB History   ? ? Gravida  ?1  ? Para  ?   ? Term  ?   ? Preterm  ?   ? AB  ?   ? Living  ?   ?  ? ? SAB  ?   ? IAB  ?   ? Ectopic  ?   ? Multiple  ?   ? Live Births  ?   ?   ?  ?  ? ? ?Past Medical History:  ?Diagnosis Date  ? Attention deficit disorder   ? Depression   ? Family history of adverse reaction to anesthesia   ? Family history of malignant hyperthermia  ? Macromastia   ? Bilateral  ? Shoulder dislocation   ? ? ?Past Surgical History:  ?Procedure Laterality Date  ? BREAST REDUCTION SURGERY Bilateral 03/01/2018  ? Procedure: BILATERAL MAMMARY REDUCTION  (BREAST);  Surgeon: Irene Limbo, MD;  Location: [facility];  Service: Plastics;  Laterality: Bilateral;  ? BUTTOCK LIFT  08/2019  ? SHOULDER SURGERY Right   ? ? ?No family history on file. ? ?Social History  ? ?Tobacco Use  ? Smoking status: Never  ? Smokeless tobacco: Never  ?Vaping Use  ? Vaping Use: Never used  ?Substance Use Topics  ? Alcohol use: No  ? Drug use: No  ? ? ?Allergies:  ?Allergies  ?Allergen Reactions  ? Penicillins Hives and Other (See Comments)  ?  Has patient had a PCN reaction causing immediate rash, facial/tongue/throat swelling, SOB or lightheadedness with hypotension: Yes ?Has patient had a PCN reaction causing severe rash involving mucus membranes or skin necrosis: No ?Has patient had a PCN reaction that required hospitalization: Yes ?Has patient had a PCN reaction  occurring within the last 10 years: No ?If all of the above answers are "NO", then may proceed with Cephalosporin use. ?  ? ? ?Medications Prior to Admission  ?Medication Sig Dispense Refill Last Dose  ? ALPRAZolam (XANAX) 0.5 MG tablet Take 0.5 mg by mouth at bedtime as needed for anxiety.     ? phentermine 37.5 MG capsule Take 37.5 mg by mouth every morning.   Past Week  ? hydrOXYzine (ATARAX/VISTARIL) 25 MG tablet Take 1 tablet (25 mg total) by mouth every 8 (eight) hours as needed for anxiety. 12 tablet 0   ? ibuprofen (ADVIL,MOTRIN) 800 MG tablet Take 1 tablet (800 mg total) by mouth 3 (three) times daily. (Patient not taking: Reported on 02/25/2018) 21 tablet 0   ? medroxyPROGESTERone (PROVERA) 5 MG tablet Take 5 mg by mouth daily.     ? ?Results for orders placed or performed during the hospital encounter of 02/06/22 (from the past 72 hour(s))  ?Pregnancy, urine POC     Status: Abnormal  ? Collection Time: 02/06/22  3:09 PM  ?Result Value Ref Range  ? Preg Test,  Ur POSITIVE (A) NEGATIVE  ?  Comment:        ?THE SENSITIVITY OF THIS ?METHODOLOGY IS >24 mIU/mL ?  ?Urinalysis, Routine w reflex microscopic Urine, Clean Catch     Status: Abnormal  ? Collection Time: 02/06/22  3:17 PM  ?Result Value Ref Range  ? Color, Urine STRAW (A) YELLOW  ? APPearance CLEAR CLEAR  ? Specific Gravity, Urine 1.009 1.005 - 1.030  ? pH 5.0 5.0 - 8.0  ? Glucose, UA NEGATIVE NEGATIVE mg/dL  ? Hgb urine dipstick SMALL (A) NEGATIVE  ? Bilirubin Urine NEGATIVE NEGATIVE  ? Ketones, ur 20 (A) NEGATIVE mg/dL  ? Protein, ur NEGATIVE NEGATIVE mg/dL  ? Nitrite NEGATIVE NEGATIVE  ? Leukocytes,Ua TRACE (A) NEGATIVE  ? RBC / HPF 0-5 0 - 5 RBC/hpf  ? WBC, UA 0-5 0 - 5 WBC/hpf  ? Bacteria, UA FEW (A) NONE SEEN  ? Squamous Epithelial / LPF 0-5 0 - 5  ? Mucus PRESENT   ?  Comment: Performed at [facility], [place] [address], [place], [place] [address]  ?GC/Chlamydia probe amp ([place])not at [facility]     Status: None  ? Collection Time: 02/06/22   4:17 PM  ?Result Value Ref Range  ? Neisseria Gonorrhea Negative   ? Chlamydia Negative   ? Comment Normal Reference Ranger Chlamydia - Negative   ? Comment    ?  Normal Reference Range Neisseria Gonorrhea - Negative  ?CBC with Differential/Platelet     Status: Abnormal  ? Collection Time: 02/06/22  4:28 PM  ?Result Value Ref Range  ? WBC 8.2 4.0 - 10.5 K/uL  ? RBC 4.57 3.87 - 5.11 MIL/uL  ? Hemoglobin 13.0 12.0 - 15.0 g/dL  ? HCT 38.3 36.0 - 46.0 %  ? MCV 83.8 80.0 - 100.0 fL  ? MCH 28.4 26.0 - 34.0 pg  ? MCHC 33.9 30.0 - 36.0 g/dL  ? RDW 13.9 11.5 - 15.5 %  ? Platelets 414 (H) 150 - 400 K/uL  ? nRBC 0.0 0.0 - 0.2 %  ? Neutrophils Relative % 58 %  ? Neutro Abs 4.7 1.7 - 7.7 K/uL  ? Lymphocytes Relative 33 %  ? Lymphs Abs 2.7 0.7 - 4.0 K/uL  ? Monocytes Relative 7 %  ? Monocytes Absolute 0.6 0.1 - 1.0 K/uL  ? Eosinophils Relative 2 %  ? Eosinophils Absolute 0.1 0.0 - 0.5 K/uL  ? Basophils Relative 0 %  ? Basophils Absolute 0.0 0.0 - 0.1 K/uL  ? Immature Granulocytes 0 %  ? Abs Immature Granulocytes 0.02 0.00 - 0.07 K/uL  ?  Comment: Performed at [facility], [place] [address] [place], [place] [address]  ?Comprehensive metabolic panel     Status: Abnormal  ? Collection Time: 02/06/22  4:28 PM  ?Result Value Ref Range  ? Sodium 138 135 - 145 mmol/L  ? Potassium 3.7 3.5 - 5.1 mmol/L  ? Chloride 109 98 - 111 mmol/L  ? CO2 24 22 - 32 mmol/L  ? Glucose, Bld 107 (H) 70 - 99 mg/dL  ?  Comment: Glucose reference range applies only to samples taken after fasting for at least 8 hours.  ? BUN 10 6 - 20 mg/dL  ? Creatinine, Ser 0.94 0.44 - 1.00 mg/dL  ? Calcium 9.4 8.9 - 10.3 mg/dL  ? Total Protein 7.3 6.5 - 8.1 g/dL  ? Albumin 4.2 3.5 - 5.0 g/dL  ? AST 18 15 - 41 U/L  ? ALT  18 0 - 44 U/L  ? Alkaline Phosphatase 46 38 - 126 U/L  ? Total Bilirubin 0.2 (L) 0.3 - 1.2 mg/dL  ? GFR, Estimated >60 >60 mL/min  ?  Comment: (NOTE) ?Calculated using the CKD-EPI Creatinine Equation (2021) ?  ? Anion gap 5 5 - 15  ?  Comment: Performed  at [facility], [place] [address], [place], [place] [address]  ?ABO/Rh     Status: None  ? Collection Time: 02/06/22  4:28 PM  ?Result Value Ref Range  ? ABO/RH(D) A POS   ? No rh immune globuloin    ?  NOT A RH IMMUNE GLOBULIN CANDIDATE, PT RH POSITIVE ?Performed at [facility], [place] [address], [place], [place] [address] ?  ?hCG, quantitative, pregnancy     Status: Abnormal  ? Collection Time: 02/06/22  4:28 PM  ?Result Value Ref Range  ? hCG, Beta Chain, Quant, S 2,265 (H) <5 mIU/mL  ?  Comment:        ?  GEST. AGE      CONC.  (mIU/mL) ?  <=1 WEEK        5 - 50 ?    2 WEEKS       50 - 500 ?    3 WEEKS       100 - 10,000 ?    4 WEEKS     1,000 - 30,000 ?    5 WEEKS     3,500 - 115,000 ?  6-8 WEEKS     12,000 - 270,000 ?   12 WEEKS     15,000 - 220,000 ?       ?FEMALE AND NON-PREGNANT FEMALE: ?    LESS THAN 5 mIU/mL ?Performed at [facility], [place] [address], [place], [place] [address] ?  ?Wet prep, genital     Status: None  ? Collection Time: 02/06/22  4:31 PM  ? Specimen: Vaginal  ?Result Value Ref Range  ? Yeast Wet Prep HPF POC NONE SEEN NONE SEEN  ? Trich, Wet Prep NONE SEEN NONE SEEN  ? Clue Cells Wet Prep HPF POC NONE SEEN NONE SEEN  ? WBC, Wet Prep HPF POC <10 <10  ? Sperm NONE SEEN   ?  Comment: Performed at [facility], [place] [address], [place], [place] [address]  ?  ?US OB LESS THAN 14 WEEKS WITH OB TRANSVAGINAL ? ?Result Date: 02/06/2022 ?CLINICAL DATA:  Cramping for 1 week EXAM: OBSTETRIC <14 WK [place] AND TRANSVAGINAL OB US TECHNIQUE: Both transabdominal and transvaginal ultrasound examinations were performed for complete evaluation of the gestation as well as the maternal uterus, adnexal regions, and pelvic cul-de-sac. Transvaginal technique was performed to assess early pregnancy. COMPARISON:  None. FINDINGS: Intrauterine gestational sac: Small fluid collection which probably represents a small gestational sac. Yolk sac:  Not Visualized. Embryo:  Not Visualized. Cardiac Activity: Not  Visualized. Heart Rate: Not applicable MSD: 3.5 mm mm   5 w   0 d Subchorionic hemorrhage:  None visualized. Maternal uterus/adnexae: Normal ovaries. Small volume free fluid in the pelvis. IMPRESSION: Probab

## 2022-02-06 NOTE — MAU Note (Signed)
Patient needing to leave to take someone to work.  Pt dc'd home and given printed AVS.  Pt unable to stay for repeat set of VS.  Verbalizes understanding of dc instructions and dc'd home in good condition.  Plans for labwork f/u on Thursday. ?

## 2022-02-06 NOTE — MAU Note (Signed)
Jill Schwartz is a 26 y.o. at [redacted]w[redacted]d here in MAU reporting: cramping for the past week. States pain is lower abdomen. No bleeding. Having some urethral itching and some discharge. States discharge is "like a liquid". ? ?LMP: 01/01/2022 ? ?Onset of complaint: ongoing ? ?Pain score: 7/10 ? ?Vitals:  ? 02/06/22 1515  ?BP: (!) 135/96  ?Pulse: (!) 109  ?Resp: 16  ?Temp: 98.4 ?F (36.9 ?C)  ?SpO2: 100%  ?   ?Lab orders placed from triage: ua, upt ? ?

## 2022-02-06 NOTE — MAU Note (Signed)
Pt called, not in lobby 

## 2022-02-07 LAB — GC/CHLAMYDIA PROBE AMP (~~LOC~~) NOT AT ARMC
Chlamydia: NEGATIVE
Comment: NEGATIVE
Comment: NORMAL
Neisseria Gonorrhea: NEGATIVE

## 2022-02-09 ENCOUNTER — Encounter: Payer: Self-pay | Admitting: Family Medicine

## 2022-02-09 ENCOUNTER — Ambulatory Visit (INDEPENDENT_AMBULATORY_CARE_PROVIDER_SITE_OTHER): Payer: No Typology Code available for payment source | Admitting: General Practice

## 2022-02-09 VITALS — BP 121/82 | HR 104 | Ht 65.0 in | Wt 192.0 lb

## 2022-02-09 DIAGNOSIS — O3680X0 Pregnancy with inconclusive fetal viability, not applicable or unspecified: Secondary | ICD-10-CM

## 2022-02-09 DIAGNOSIS — O219 Vomiting of pregnancy, unspecified: Secondary | ICD-10-CM

## 2022-02-09 LAB — BETA HCG QUANT (REF LAB): hCG Quant: 4096 m[IU]/mL

## 2022-02-09 MED ORDER — PROMETHAZINE HCL 25 MG PO TABS: 25.0000 mg | ORAL_TABLET | Freq: Four times a day (QID) | ORAL | 0 refills | Status: DC | PRN

## 2022-02-09 NOTE — Progress Notes (Signed)
Patient presents to office today for stat bhcg following up from MAU visit on 3/27. Patient states pain has improved and only notes mild cramping now- denies bleeding. She does endorse nausea and would like Rx. Rx for phenergan sent to pharmacy per protocol. Discussed with patient we are monitoring your bhcg levels today, results take approximately 2 hours to finalize and will be reviewed with a provider. Advised we will then call you with results. Patient verbalized understanding. ? ?Reviewed results with Dr Kennon Rounds who finds appropriate rise in bhcg levels- patient needs follow up ultrasound in 10-14 days.  ? ?Called patient and informed her of results and follow up ultrasound recommendation. Scheduled ultrasound for 4/11 in our office. Ectopic precautions reviewed. Patient will follow up then. ? ?Koren Bound RN BSN ?02/09/22 ? ?

## 2022-02-21 ENCOUNTER — Ambulatory Visit (INDEPENDENT_AMBULATORY_CARE_PROVIDER_SITE_OTHER): Payer: No Typology Code available for payment source

## 2022-02-21 DIAGNOSIS — O3680X0 Pregnancy with inconclusive fetal viability, not applicable or unspecified: Secondary | ICD-10-CM | POA: Diagnosis not present

## 2022-02-22 ENCOUNTER — Telehealth: Payer: Self-pay

## 2022-02-22 NOTE — Telephone Encounter (Signed)
Pt called stating sometimes she feels like she is going to pass out. Pt states she has been eating a little more but still feels faint. Advised pt to try eating 4 small meals and to increase her fluids and if her symptoms persist, we recommend she go to [facility] to be seen. Understanding was voiced. ?Tehillah Cipriani l Borton, CMA  ?

## 2022-03-15 ENCOUNTER — Other Ambulatory Visit (HOSPITAL_COMMUNITY)
Admission: RE | Admit: 2022-03-15 | Discharge: 2022-03-15 | Disposition: A | Discharge status: Home / Self Care | Payer: Medicaid Other | Source: Ambulatory Visit | Attending: Family Medicine | Admitting: Family Medicine

## 2022-03-15 ENCOUNTER — Encounter: Payer: Self-pay | Admitting: General Practice

## 2022-03-15 ENCOUNTER — Ambulatory Visit (INDEPENDENT_AMBULATORY_CARE_PROVIDER_SITE_OTHER): Payer: Medicaid Other | Admitting: Family Medicine

## 2022-03-15 ENCOUNTER — Encounter: Payer: Self-pay | Admitting: Family Medicine

## 2022-03-15 VITALS — BP 119/74 | HR 93 | Wt 183.0 lb

## 2022-03-15 DIAGNOSIS — Z3A10 10 weeks gestation of pregnancy: Secondary | ICD-10-CM

## 2022-03-15 DIAGNOSIS — Z6830 Body mass index (BMI) 30.0-30.9, adult: Secondary | ICD-10-CM | POA: Diagnosis not present

## 2022-03-15 DIAGNOSIS — F419 Anxiety disorder, unspecified: Secondary | ICD-10-CM | POA: Insufficient documentation

## 2022-03-15 DIAGNOSIS — O99341 Other mental disorders complicating pregnancy, first trimester: Secondary | ICD-10-CM | POA: Insufficient documentation

## 2022-03-15 DIAGNOSIS — Z3400 Encounter for supervision of normal first pregnancy, unspecified trimester: Secondary | ICD-10-CM | POA: Diagnosis present

## 2022-03-15 NOTE — Progress Notes (Signed)
?Subjective:  ?Jill Schwartz is a G1P0 [redacted]w[redacted]d By LMP c/w 6 week [place], being seen today for her first obstetrical visit.  Her obstetrical history is significant for  BMI 30, first pregnancy . Patient does intend to breast feed. Pregnancy history fully reviewed. ? ?Patient reports nausea. ? ?BP 119/74   Pulse 93   Wt 183 lb (83 kg)   LMP 01/01/2022   BMI 30.45 kg/m?  ? ?HISTORY: ?OB History  ?Gravida Para Term Preterm AB Living  ?1            ?SAB IAB Ectopic Multiple Live Births  ?           ?  ?# Outcome Date GA Lbr Len/2nd Weight Sex Delivery Anes PTL Lv  ?1 Current           ? ? ?Past Medical History:  ?Diagnosis Date  ? Attention deficit disorder   ? Depression   ? Family history of adverse reaction to anesthesia   ? Family history of malignant hyperthermia  ? Macromastia   ? Bilateral  ? Shoulder dislocation   ? ? ?Past Surgical History:  ?Procedure Laterality Date  ? BREAST REDUCTION SURGERY Bilateral 03/01/2018  ? Procedure: BILATERAL MAMMARY REDUCTION  (BREAST);  Surgeon: Glenna Fellows, MD;  [facility];  Service: Plastics;  Laterality: Bilateral;  ? BUTTOCK LIFT  08/2019  ? SHOULDER SURGERY Right   ? ? ?Family History  ?Problem Relation Age of Onset  ? Hypertension Maternal Grandmother   ? Hypertension Maternal Grandfather   ? ? ? ?Exam  ?BP 119/74   Pulse 93   Wt 183 lb (83 kg)   LMP 01/01/2022   BMI 30.45 kg/m?  ? ?Chaperone present during exam ? ?CONSTITUTIONAL: Well-developed, well-nourished female in no acute distress.  ?HENT:  Normocephalic, atraumatic, External right and left ear normal. Oropharynx is clear and moist ?EYES: Conjunctivae and EOM are normal. Pupils are equal, round, and reactive to light. No scleral icterus.  ?NECK: Normal range of motion, supple, no masses.  Normal thyroid.  ?CARDIOVASCULAR: Normal heart rate noted, regular rhythm ?RESPIRATORY: Clear to auscultation bilaterally. Effort and breath sounds normal, no problems with respiration noted. ?BREASTS: Symmetric in  size. No masses, skin changes, nipple drainage, or lymphadenopathy. ?ABDOMEN: Soft, normal bowel sounds, no distention noted.  No tenderness, rebound or guarding.  ?PELVIC: Normal appearing external genitalia; normal appearing vaginal mucosa and cervix. No abnormal discharge noted.  ?MUSCULOSKELETAL: Normal range of motion. No tenderness.  No cyanosis, clubbing, or edema.  2+ distal pulses. ?SKIN: Skin is warm and dry. No rash noted. Not diaphoretic. No erythema. No pallor. ?NEUROLOGIC: Alert and oriented to person, place, and time. Normal reflexes, muscle tone coordination. No cranial nerve deficit noted. ?PSYCHIATRIC: Normal mood and affect. Normal behavior. Normal judgment and thought content. ? ?  ?Assessment:  ? ? Pregnancy: G1P0 ?Patient Active Problem List  ? Diagnosis Date Noted  ? Supervision of normal first pregnancy, antepartum 03/15/2022  ? Anxiety 03/15/2022  ? BMI 30.0-30.9,adult 03/15/2022  ? ? ?  ?Plan:  ? ?1. Supervision of normal first pregnancy, antepartum ?- Cytology - PAP( [place]) ?- CBC/D/Plt+RPR+Rh+ABO+RubIgG... ?- Babyscripts Schedule Optimization ?- Urine Culture ?- Hepatitis C Antibody ? ?2. [redacted] weeks gestation of pregnancy ?- Cytology - PAP( [place]) ?- CBC/D/Plt+RPR+Rh+ABO+RubIgG... ?- Babyscripts Schedule Optimization ?- Urine Culture ?- Hepatitis C Antibody ?- [place] MFM OB DETAIL +14 WK; Future ? ?3. Anxiety ?She is on buspar taking only once a day. Reassured  patient that buspar is safe in pregnancy. Can take up to 3 times a day, as prescribed by her psychiatrist. ?- Cytology - PAP( [place]) ?- CBC/D/Plt+RPR+Rh+ABO+RubIgG... ?- Babyscripts Schedule Optimization ?- Urine Culture ?- Hepatitis C Antibody ?- [place] MFM OB DETAIL +14 WK; Future ?- Ambulatory referral to [facility] ? ?4. BMI 30.0-30.9,adult ? ?- [place] MFM OB DETAIL +14 WK; Future ? ?  ?Initial labs obtained ?Continue prenatal vitamins ?Reviewed n/v relief measures and warning s/s to report ?Reviewed  recommended weight gain based on pre-gravid BMI ?Encouraged well-balanced diet ?Genetic & carrier screening discussed: requests Panorama, requests Horizon  ?Ultrasound discussed; fetal survey: requested ?CCNC completed> form faxed if has or is planning to apply for medicaid ?The nature of [facility] with multiple MDs and other Advanced Practice Providers was explained to patient; also emphasized that fellows, residents, and students are part of our team. ?Due to risk factors - start ASA 81mg  at 12 weeks. ? ? ?Problem list reviewed and updated. ?75% of 30 min visit spent on counseling and coordination of care.  ?  ? ? ?03/15/2022 ? ?

## 2022-03-15 NOTE — Progress Notes (Signed)
DATING AND VIABILITY SONOGRAM ? ? ?Jill Schwartz is a 26 y.o. year old G1P0 with LMP Patient's last menstrual period was 01/01/2022. which would correlate to  [redacted]w[redacted]d weeks gestation.  She has regular menstrual cycles.   She is here today for a confirmatory initial sonogram. ? ? ? ?GESTATION: ?SINGLETON    ? ?FETAL ACTIVITY: ?         Heart rate         170 ?         The fetus is active. ? ?PLACENTA LOCALIZATION: ? anterior ? ? ?ADNEXA: ?The ovaries are normal. ? ? ?GESTATIONAL AGE AND  BIOMETRICS: ? ?Gestational criteria: Estimated Date of Delivery: 10/08/22 by LMP now at [redacted]w[redacted]d ? ?Previous Scans:2 ? ?    ?CROWN RUMP LENGTH           2.89 cm         9-5 weeks  ?      2.88 cm          9-5 weeks  ?    ?    ?    ?    ?    ?    ? ?                                                AVERAGE EGA(BY THIS SCAN):  9-5 weeks ? ?WORKING EDD( LMP ):  10-11-2022 ?  ? ? ?TECHNICIAN COMMENTS: ? ?Patient informed that the ultrasound is considered a limited obstetric ultrasound and is not intended to be a complete ultrasound exam.  Patient also informed that the ultrasound is not being completed with the intent of assessing for fetal or placental anomalies or any pelvic abnormalities. Explained that the purpose of today's ultrasound is to assess for fetal heart rate.  Patient acknowledges the purpose of the exam and the limitations of the study.    ? ?Armandina Stammer ?03/15/2022 ?9:03 AM ?  ?

## 2022-03-16 LAB — CYTOLOGY - PAP
Chlamydia: NEGATIVE
Comment: NEGATIVE
Comment: NORMAL
Diagnosis: NEGATIVE
Neisseria Gonorrhea: NEGATIVE

## 2022-03-16 LAB — HEPATITIS C ANTIBODY: Hep C Virus Ab: NONREACTIVE

## 2022-03-17 LAB — URINE CULTURE

## 2022-03-18 LAB — CBC/D/PLT+RPR+RH+ABO+RUBIGG...
Basophils Absolute: 0 10*3/uL (ref 0.0–0.2)
Basos: 0 %
EOS (ABSOLUTE): 0.2 10*3/uL (ref 0.0–0.4)
Eos: 2 %
HCV Ab: NONREACTIVE
HIV Screen 4th Generation wRfx: NONREACTIVE
Hematocrit: 37.3 % (ref 34.0–46.6)
Hemoglobin: 12.3 g/dL (ref 11.1–15.9)
Hepatitis B Surface Ag: NEGATIVE
Immature Grans (Abs): 0 10*3/uL (ref 0.0–0.1)
Immature Granulocytes: 0 %
Lymphocytes Absolute: 2.1 10*3/uL (ref 0.7–3.1)
Lymphs: 28 %
MCH: 28.7 pg (ref 26.6–33.0)
MCHC: 33 g/dL (ref 31.5–35.7)
MCV: 87 fL (ref 79–97)
Monocytes Absolute: 0.6 10*3/uL (ref 0.1–0.9)
Monocytes: 8 %
Neutrophils Absolute: 4.7 10*3/uL (ref 1.4–7.0)
Neutrophils: 62 %
Platelets: 434 10*3/uL (ref 150–450)
RBC: 4.28 x10E6/uL (ref 3.77–5.28)
RDW: 14.3 % (ref 11.7–15.4)
RPR Ser Ql: NONREACTIVE
Rh Factor: POSITIVE
Rubella Antibodies, IGG: 4.56 index (ref >0.99)
WBC: 7.6 10*3/uL (ref 3.4–10.8)

## 2022-03-18 LAB — AB SCR+ANTIBODY ID

## 2022-03-18 LAB — HCV INTERPRETATION

## 2022-03-29 ENCOUNTER — Other Ambulatory Visit: Payer: Self-pay

## 2022-03-29 ENCOUNTER — Ambulatory Visit: Payer: Medicaid Other

## 2022-03-30 ENCOUNTER — Ambulatory Visit: Payer: Medicaid Other

## 2022-03-30 DIAGNOSIS — Z3A12 12 weeks gestation of pregnancy: Secondary | ICD-10-CM

## 2022-03-30 DIAGNOSIS — Z3400 Encounter for supervision of normal first pregnancy, unspecified trimester: Secondary | ICD-10-CM

## 2022-04-03 ENCOUNTER — Other Ambulatory Visit: Payer: Self-pay

## 2022-04-03 MED ORDER — CONCEPT DHA 53.5-38-1 MG PO CAPS: 1.0000 | ORAL_CAPSULE | Freq: Every day | ORAL | 10 refills | Status: DC

## 2022-04-04 ENCOUNTER — Emergency Department (HOSPITAL_BASED_OUTPATIENT_CLINIC_OR_DEPARTMENT_OTHER): Payer: Medicaid Other

## 2022-04-04 ENCOUNTER — Emergency Department (HOSPITAL_BASED_OUTPATIENT_CLINIC_OR_DEPARTMENT_OTHER)
Admission: EM | Admit: 2022-04-04 | Discharge: 2022-04-05 | Disposition: A | Discharge status: Home / Self Care | Payer: Medicaid Other | Attending: Emergency Medicine | Admitting: Emergency Medicine

## 2022-04-04 ENCOUNTER — Encounter (HOSPITAL_BASED_OUTPATIENT_CLINIC_OR_DEPARTMENT_OTHER): Payer: Self-pay

## 2022-04-04 ENCOUNTER — Other Ambulatory Visit: Payer: Self-pay

## 2022-04-04 DIAGNOSIS — Z3A13 13 weeks gestation of pregnancy: Secondary | ICD-10-CM | POA: Insufficient documentation

## 2022-04-04 DIAGNOSIS — O219 Vomiting of pregnancy, unspecified: Secondary | ICD-10-CM | POA: Diagnosis not present

## 2022-04-04 DIAGNOSIS — H538 Other visual disturbances: Secondary | ICD-10-CM | POA: Diagnosis not present

## 2022-04-04 DIAGNOSIS — O99891 Other specified diseases and conditions complicating pregnancy: Secondary | ICD-10-CM | POA: Diagnosis present

## 2022-04-04 DIAGNOSIS — R519 Headache, unspecified: Secondary | ICD-10-CM | POA: Diagnosis not present

## 2022-04-04 MED ORDER — PROMETHAZINE HCL 25 MG/ML IJ SOLN
INTRAMUSCULAR | Status: AC
  Administered 2022-04-04: 25 mg
  Filled 2022-04-04: qty 1

## 2022-04-04 MED ORDER — SODIUM CHLORIDE 0.9 % IV SOLN: INTRAVENOUS | Status: DC

## 2022-04-04 MED ORDER — SODIUM CHLORIDE 0.9 % IV SOLN
12.5000 mg | Freq: Four times a day (QID) | INTRAVENOUS | Status: DC | PRN
  Filled 2022-04-04: qty 0.5

## 2022-04-04 MED ORDER — DEXAMETHASONE SODIUM PHOSPHATE 10 MG/ML IJ SOLN
10.0000 mg | Freq: Once | INTRAMUSCULAR | Status: AC
  Administered 2022-04-04: 10 mg via INTRAVENOUS
  Filled 2022-04-04: qty 1

## 2022-04-04 MED ORDER — DIPHENHYDRAMINE HCL 50 MG/ML IJ SOLN
25.0000 mg | Freq: Once | INTRAMUSCULAR | Status: AC
  Administered 2022-04-04: 25 mg via INTRAVENOUS
  Filled 2022-04-04: qty 1

## 2022-04-04 MED ORDER — SODIUM CHLORIDE 0.9 % IV BOLUS
1000.0000 mL | Freq: Once | INTRAVENOUS | Status: AC
  Administered 2022-04-04: 1000 mL via INTRAVENOUS

## 2022-04-04 NOTE — ED Triage Notes (Signed)
Pt presents to ED from home C/O migraine and blurred vision since yesterday. Pt also reports being pregnant, 13 weeks 3 days. Denies hx of migraines.

## 2022-04-04 NOTE — Discharge Instructions (Signed)
Rest in a dark room.  Make an appointment to follow-up with OB/GYN in the next week.  Return for any new or worse symptoms.  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  Expect the migraine cocktail to help abate the headache.

## 2022-04-04 NOTE — ED Provider Notes (Addendum)
[facility] EMERGENCY DEPARTMENT Provider Note   CSN: [id number] Arrival date & time: 04/04/22  1959     History  Chief Complaint  Patient presents with   Migraine    Jill Schwartz is a 26 y.o. female.  Patient is approximately [redacted] weeks pregnant.  Followed by OB/GYN.  Gravida 1 para 0.  Patient's been struggling for about 12 weeks with some headaches kind of behind the eyes.  Yesterday got a bit worse, spread a little bit towards the sides of the face and sides of the head.  No falls no injuries.  No shaded with some nausea and vomiting.  But rare.  No fevers.  Associated with some blurred vision but no photophobia.  Arms and legs are working fine.  No significant abdominal pain.  No vaginal bleeding.  Pregnancy to date has been uncomplicated.  Blood pressure here is 114/84.  Patient did not suffer from headaches or migraines prior to the pregnancy.  But has had this quite frequently during the pregnancy.  Past medical history significant for attention disorder.  Surgical history significant for breast reduction surgery.      Home Medications Prior to Admission medications   Medication Sig Start Date End Date Taking? Authorizing Provider  busPIRone (BUSPAR) 7.5 MG tablet Take by mouth. 03/06/22   [provider]  Prenat-FeFum-FePo-FA-Omega 3 (CONCEPT DHA) 53.5-38-1 MG CAPS Take 1 capsule by mouth daily. 04/03/22   Levie Heritage, DO  promethazine (PHENERGAN) 25 MG tablet Take 1 tablet (25 mg total) by mouth every 6 (six) hours as needed for nausea or vomiting. 02/09/22   Reva Bores, MD      Allergies    Penicillins    Review of Systems   Review of Systems  Constitutional:  Negative for chills and fever.  HENT:  Negative for ear pain and sore throat.   Eyes:  Positive for visual disturbance. Negative for photophobia and pain.  Respiratory:  Negative for cough and shortness of breath.   Cardiovascular:  Negative for chest pain and palpitations.   Gastrointestinal:  Positive for nausea and vomiting. Negative for abdominal pain.  Genitourinary:  Negative for dysuria, hematuria, pelvic pain and vaginal bleeding.  Musculoskeletal:  Negative for arthralgias and back pain.  Skin:  Negative for color change and rash.  Neurological:  Positive for headaches. Negative for dizziness, seizures, syncope, facial asymmetry, speech difficulty, weakness and numbness.  All other systems reviewed and are negative.  Physical Exam Updated Vital Signs BP 114/84 (BP Location: Left Arm)   Pulse (!) 106   Temp 98.6 F (37 C) (Oral)   Resp 20   Ht 1.651 m (5\' 5" )   Wt 81.2 kg   LMP 01/01/2022   SpO2 95%   BMI 29.79 kg/m  Physical Exam  ED Results / Procedures / Treatments   Labs (all labs ordered are listed, but only abnormal results are displayed) Labs Reviewed - No data to display  EKG None  Radiology No results found.  Procedures Procedures    Medications Ordered in ED Medications  0.9 %  sodium chloride infusion (has no administration in time range)  sodium chloride 0.9 % bolus 1,000 mL (has no administration in time range)  dexamethasone (DECADRON) injection 10 mg (has no administration in time range)  diphenhydrAMINE (BENADRYL) injection 25 mg (has no administration in time range)  promethazine (PHENERGAN) 12.5 mg in sodium chloride 0.9 % 50 mL IVPB (has no administration in time range)  ED Course/ Medical Decision Making/ A&P                           Medical Decision Making Risk Prescription drug management.   [place]um certainly consistent with headache disorder.  For the past 2 days there have been some symptoms suggestive of migraine headache.  Suspect this is probably like migraine equivalent.  Patient without history of migraines prior to pregnancy.  Patient's had headaches practically every day throughout the pregnancy so far.  But the past 2 days is gotten a little bit worse.  Patient nontoxic no acute  distress.  We will treat with IV fluids migraine cocktail.  The cocktail will be Decadron Phenergan and Benadryl.  Patient will have fetal heart tones checked.  Most likely patient will be able to be discharged home follow-up with OB/GYN rest in a dark room.  Work note will be provided.  Final Clinical Impression(s) / ED Diagnoses Final diagnoses:  [redacted] weeks gestation of pregnancy  Headache disorder    Rx / DC Orders ED Discharge Orders     None         Vanetta Mulders, MD 04/04/22 4174    Vanetta Mulders, MD 04/04/22 2236

## 2022-04-05 ENCOUNTER — Telehealth: Payer: Self-pay

## 2022-04-05 NOTE — Telephone Encounter (Signed)
Patient given Panorama results. Kathrene Alu RN

## 2022-04-13 ENCOUNTER — Ambulatory Visit (INDEPENDENT_AMBULATORY_CARE_PROVIDER_SITE_OTHER): Payer: Medicaid Other | Admitting: Obstetrics & Gynecology

## 2022-04-13 VITALS — BP 108/68 | HR 101 | Wt 178.0 lb

## 2022-04-13 DIAGNOSIS — Z3400 Encounter for supervision of normal first pregnancy, unspecified trimester: Secondary | ICD-10-CM

## 2022-04-13 DIAGNOSIS — Z3A14 14 weeks gestation of pregnancy: Secondary | ICD-10-CM

## 2022-04-13 MED ORDER — PRENATAL VITAMINS 28-0.8 MG PO TABS: ORAL_TABLET | ORAL | 11 refills | Status: AC

## 2022-04-13 NOTE — Progress Notes (Signed)
   PRENATAL VISIT NOTE  Subjective:  Jill Schwartz is a 26 y.o. G1P0 at [redacted]w[redacted]d being seen today for ongoing prenatal care.  She is currently monitored for the following issues for this low-risk pregnancy and has Supervision of normal first pregnancy, antepartum; Anxiety; and BMI 30.0-30.9,adult on their problem list.  Patient reports headache.  Contractions: Not present. Vag. Bleeding: None.  Movement: Absent. Denies leaking of fluid.   The following portions of the patient's history were reviewed and updated as appropriate: allergies, current medications, past family history, past medical history, past social history, past surgical history and problem list.   Objective:   Vitals:   04/13/22 1054  BP: 108/68  Pulse: (!) 101  Weight: 178 lb (80.7 kg)    Fetal Status: Fetal Heart Rate (bpm): 147   Movement: Absent     General:  Alert, oriented and cooperative. Patient is in no acute distress.  Skin: Skin is warm and dry. No rash noted.   Cardiovascular: Normal heart rate noted  Respiratory: Normal respiratory effort, no problems with respiration noted  Abdomen: Soft, gravid, appropriate for gestational age.  Pain/Pressure: Absent     Pelvic: Cervical exam deferred        Extremities: Normal range of motion.  Edema: None  Mental Status: Normal mood and affect. Normal behavior. Normal judgment and thought content.   Assessment and Plan:  Pregnancy: G1P0 at [redacted]w[redacted]d 1. [redacted] weeks gestation of pregnancy She was treated for presumed migraine last week. Very mild sx now, may use OTC headache medication but would have her seen Nada Maclachlan if sx persist  2. Supervision of normal first pregnancy, antepartum AFP next visit  Preterm labor symptoms and general obstetric precautions including but not limited to vaginal bleeding, contractions, leaking of fluid and fetal movement were reviewed in detail with the patient. Please refer to After Visit Summary for other counseling  recommendations.   Return in about 4 weeks (around 05/11/2022).  Future Appointments  Date Time Provider Department Center  05/11/2022  2:30 PM Levie Heritage, DO [facility] None  05/12/2022  2:15 PM [facility] NURSE [facility]  05/12/2022  2:30 PM [facility] US2 [facility]  06/09/2022  8:55 AM Anyanwu, Jethro Bastos, MD [facility] None    Scheryl Darter, MD

## 2022-04-17 ENCOUNTER — Telehealth: Payer: Self-pay | Admitting: Clinical

## 2022-04-17 NOTE — Telephone Encounter (Signed)
Call regarding referral; pt declines visit at this time, as she already has a therapist and a psychiatrist; agrees to call [facility] at [phone] if needed in the future.

## 2022-05-02 ENCOUNTER — Encounter: Payer: Self-pay | Admitting: Obstetrics & Gynecology

## 2022-05-11 ENCOUNTER — Ambulatory Visit (INDEPENDENT_AMBULATORY_CARE_PROVIDER_SITE_OTHER): Payer: Medicaid Other | Admitting: Family Medicine

## 2022-05-11 VITALS — BP 114/72 | HR 108 | Wt 178.0 lb

## 2022-05-11 DIAGNOSIS — F419 Anxiety disorder, unspecified: Secondary | ICD-10-CM

## 2022-05-11 DIAGNOSIS — Z3A18 18 weeks gestation of pregnancy: Secondary | ICD-10-CM

## 2022-05-11 DIAGNOSIS — Z6830 Body mass index (BMI) 30.0-30.9, adult: Secondary | ICD-10-CM

## 2022-05-11 DIAGNOSIS — Z3400 Encounter for supervision of normal first pregnancy, unspecified trimester: Secondary | ICD-10-CM

## 2022-05-11 NOTE — Progress Notes (Signed)
   PRENATAL VISIT NOTE  Subjective:  Jill Schwartz is a 26 y.o. G1P0 at [redacted]w[redacted]d being seen today for ongoing prenatal care.  She is currently monitored for the following issues for this low-risk pregnancy and has Supervision of normal first pregnancy, antepartum; Anxiety; and BMI 30.0-30.9,adult on their problem list.  Patient reports no complaints.  Contractions: Not present. Vag. Bleeding: None.  Movement: Absent. Denies leaking of fluid.   The following portions of the patient's history were reviewed and updated as appropriate: allergies, current medications, past family history, past medical history, past social history, past surgical history and problem list.   Objective:   Vitals:   05/11/22 1410  BP: 114/72  Pulse: (!) 108  Weight: 178 lb (80.7 kg)    Fetal Status: Fetal Heart Rate (bpm): 148   Movement: Absent     General:  Alert, oriented and cooperative. Patient is in no acute distress.  Skin: Skin is warm and dry. No rash noted.   Cardiovascular: Normal heart rate noted  Respiratory: Normal respiratory effort, no problems with respiration noted  Abdomen: Soft, gravid, appropriate for gestational age.  Pain/Pressure: Present     Pelvic: Cervical exam deferred        Extremities: Normal range of motion.  Edema: None  Mental Status: Normal mood and affect. Normal behavior. Normal judgment and thought content.   Assessment and Plan:  Pregnancy: G1P0 at [redacted]w[redacted]d 1. [redacted] weeks gestation of pregnancy - AFP, Serum, Open Spina Bifida  2. Supervision of normal first pregnancy, antepartum [place] tomorrow ASA 81mg   3. Anxiety  4. BMI 30.0-30.9,adult   Preterm labor symptoms and general obstetric precautions including but not limited to vaginal bleeding, contractions, leaking of fluid and fetal movement were reviewed in detail with the patient. Please refer to After Visit Summary for other counseling recommendations.   No follow-ups on file.  Future Appointments  Date Time  Provider Department Center  05/12/2022  2:15 PM [facility] NURSE [facility]  05/12/2022  2:30 PM [facility]  06/09/2022  8:55 AM Anyanwu, 06/11/2022, MD [facility] None    Jethro Bastos, DO

## 2022-05-12 ENCOUNTER — Other Ambulatory Visit: Payer: Self-pay | Admitting: *Deleted

## 2022-05-12 ENCOUNTER — Ambulatory Visit: Payer: Medicaid Other | Attending: Family Medicine | Admitting: *Deleted

## 2022-05-12 ENCOUNTER — Ambulatory Visit (HOSPITAL_BASED_OUTPATIENT_CLINIC_OR_DEPARTMENT_OTHER): Payer: Medicaid Other

## 2022-05-12 VITALS — BP 109/71 | HR 101

## 2022-05-12 DIAGNOSIS — O99342 Other mental disorders complicating pregnancy, second trimester: Secondary | ICD-10-CM | POA: Diagnosis not present

## 2022-05-12 DIAGNOSIS — F419 Anxiety disorder, unspecified: Secondary | ICD-10-CM | POA: Diagnosis not present

## 2022-05-12 DIAGNOSIS — Z3A18 18 weeks gestation of pregnancy: Secondary | ICD-10-CM

## 2022-05-12 DIAGNOSIS — Z363 Encounter for antenatal screening for malformations: Secondary | ICD-10-CM | POA: Diagnosis not present

## 2022-05-12 DIAGNOSIS — Z3A10 10 weeks gestation of pregnancy: Secondary | ICD-10-CM | POA: Diagnosis not present

## 2022-05-12 DIAGNOSIS — O99212 Obesity complicating pregnancy, second trimester: Secondary | ICD-10-CM | POA: Diagnosis not present

## 2022-05-12 DIAGNOSIS — Z6830 Body mass index (BMI) 30.0-30.9, adult: Secondary | ICD-10-CM

## 2022-05-12 DIAGNOSIS — Z362 Encounter for other antenatal screening follow-up: Secondary | ICD-10-CM

## 2022-05-13 LAB — AFP, SERUM, OPEN SPINA BIFIDA
AFP MoM: 1.57
AFP Value: 60.9 ng/mL
Gest. Age on Collection Date: 17 weeks
Maternal Age At EDD: 25.4 yr
OSBR Risk 1 IN: 4541
Test Results:: NEGATIVE
Weight: 178 [lb_av]

## 2022-05-15 ENCOUNTER — Ambulatory Visit: Payer: Medicaid Other

## 2022-06-07 ENCOUNTER — Ambulatory Visit: Payer: Medicaid Other | Attending: Obstetrics and Gynecology

## 2022-06-07 ENCOUNTER — Ambulatory Visit: Payer: Medicaid Other | Admitting: *Deleted

## 2022-06-07 ENCOUNTER — Encounter: Payer: Self-pay | Admitting: *Deleted

## 2022-06-07 VITALS — BP 120/67 | HR 107

## 2022-06-07 DIAGNOSIS — E669 Obesity, unspecified: Secondary | ICD-10-CM

## 2022-06-07 DIAGNOSIS — Z3A22 22 weeks gestation of pregnancy: Secondary | ICD-10-CM | POA: Diagnosis not present

## 2022-06-07 DIAGNOSIS — O99212 Obesity complicating pregnancy, second trimester: Secondary | ICD-10-CM | POA: Diagnosis not present

## 2022-06-07 DIAGNOSIS — Z362 Encounter for other antenatal screening follow-up: Secondary | ICD-10-CM | POA: Insufficient documentation

## 2022-06-07 DIAGNOSIS — O358XX0 Maternal care for other (suspected) fetal abnormality and damage, not applicable or unspecified: Secondary | ICD-10-CM

## 2022-06-09 ENCOUNTER — Ambulatory Visit (INDEPENDENT_AMBULATORY_CARE_PROVIDER_SITE_OTHER): Payer: Medicaid Other | Admitting: Obstetrics & Gynecology

## 2022-06-09 ENCOUNTER — Encounter: Payer: Self-pay | Admitting: Obstetrics & Gynecology

## 2022-06-09 VITALS — BP 114/71 | HR 99 | Wt 179.0 lb

## 2022-06-09 DIAGNOSIS — Z3400 Encounter for supervision of normal first pregnancy, unspecified trimester: Secondary | ICD-10-CM

## 2022-06-09 DIAGNOSIS — Z3A22 22 weeks gestation of pregnancy: Secondary | ICD-10-CM

## 2022-06-09 NOTE — Progress Notes (Signed)
PRENATAL VISIT NOTE  Subjective:  Jill Schwartz is a 26 y.o. G1P0 at [redacted]w[redacted]d being seen today for ongoing prenatal care.  She is currently monitored for the following issues for this low-risk pregnancy and has Supervision of normal first pregnancy, antepartum; Anxiety; and BMI 30.0-30.9,adult on their problem list.  Patient reports no complaints.  Contractions: Not present. Vag. Bleeding: None.  Movement: Present. Denies leaking of fluid.   The following portions of the patient's history were reviewed and updated as appropriate: allergies, current medications, past family history, past medical history, past social history, past surgical history and problem list.   Objective:   Vitals:   06/09/22 0851  BP: 114/71  Pulse: 99  Weight: 179 lb (81.2 kg)    Fetal Status: Fetal Heart Rate (bpm): 138   Movement: Present     General:  Alert, oriented and cooperative. Patient is in no acute distress.  Skin: Skin is warm and dry. No rash noted.   Cardiovascular: Normal heart rate noted  Respiratory: Normal respiratory effort, no problems with respiration noted  Abdomen: Soft, gravid, appropriate for gestational age.  Pain/Pressure: Present     Pelvic: Cervical exam deferred        Extremities: Normal range of motion.  Edema: None  Mental Status: Normal mood and affect. Normal behavior. Normal judgment and thought content.   Imaging: [place] MFM OB FOLLOW UP  Result Date: 06/07/2022 ----------------------------------------------------------------------  OBSTETRICS REPORT                       (Signed Final 06/07/2022 01:53 pm) ---------------------------------------------------------------------- Patient Info  ID #:       [id number]                          D.O.B.:  May 02, 1996 (26 yrs)  Name:       Jill Schwartz               Visit Date: 06/07/2022 10:43 am ---------------------------------------------------------------------- Performed By  Attending:        Noralee Space MD        Ref. Address:      [address]  Performed By:     Reinaldo Raddle            [facility]                                     [facility]                                                             [facility]  Referred By:      [facility] ---------------------------------------------------------------------- Orders  #  Description                           Code        Ordered By  1  [place] MFM OB FOLLOW UP                   [phone]    Noralee Space ----------------------------------------------------------------------  #  Order #                     Accession #                Episode #  1  [id number]                   [id number]                 [id number] ---------------------------------------------------------------------- Indications  Obesity complicating pregnancy, second         O99.212  trimester (pregravid BMI 30.45)  Encounter for other antenatal screening        Z36.2  follow-up  [redacted] weeks gestation of pregnancy                Z3A.22  Low risk NIPS, neg Horizon ---------------------------------------------------------------------- Fetal Evaluation  Num Of Fetuses:         1  Fetal Heart Rate(bpm):  157  Cardiac Activity:       Observed  Presentation:           Cephalic  Placenta:               Anterior  P. Cord Insertion:      Visualized, central  Amniotic Fluid  AFI FV:      Within normal limits                              Largest Pocket(cm)                              6.56 ---------------------------------------------------------------------- Biometry  BPD:     53.53  mm     G. Age:  22w 2d         40  %    CI:        75.81   %    70 - 86                                                          FL/HC:      19.2   %    18.4 - 20.2  HC:      194.9  mm     G. Age:  21w 5d         13  %    HC/AC:      1.16        1.06 - 1.25  AC:      168.3   mm     G. Age:  21w 6d         24  %    FL/BPD:     69.9   %  71 - 87  FL:      37.42  mm     G. Age:  22w 0d         24  %    FL/AC:      22.2   %    20 - 24  HUM:      36.4  mm     G. Age:  22w 5d         51  %  LV:        5.6  mm  Est. FW:     458  gm           1 lb     19  % ---------------------------------------------------------------------- OB History  Gravidity:    1         Term:   0        Prem:   0        SAB:   0  TOP:          0       Ectopic:  0        Living: 0 ---------------------------------------------------------------------- Gestational Age  LMP:           22w 3d        Date:  01/01/22                  EDD:   10/08/22  U/S Today:     22w 0d                                        EDD:   10/11/22  Best:          22w 3d     Det. By:  LMP  (01/01/22)          EDD:   10/08/22 ---------------------------------------------------------------------- Anatomy  Cranium:               Appears normal         Aortic Arch:            Previously seen  Cavum:                 Appears normal         Ductal Arch:            Previously seen  Ventricles:            Appears normal         Diaphragm:              Appears normal  Choroid Plexus:        Previously seen        Stomach:                Appears normal, left                                                                        sided  Cerebellum:            Previously seen        Abdomen:  Appears normal  Posterior Fossa:       Previously seen        Abdominal Wall:         Previously seen  Nuchal Fold:           Previously seen        Cord Vessels:           Previously seen  Face:                  Orbits and profile     Kidneys:                Appear normal                         previously seen  Lips:                  Previously seen        Bladder:                Appears normal  Thoracic:              Appears normal         Spine:                  Previously seen  Heart:                 Appears normal         Upper Extremities:       Previously seen                         (4CH, axis, and                         situs)  RVOT:                  Not well visualized    Lower Extremities:      Previously seen  LVOT:                  Previously seen  Other:  Feet/Left heel, open hands/5th digits, 3VV, VC, and nasal bone prev          visualized. Fetus prev appears to be a female. Technically difficult due          to fetal position. ---------------------------------------------------------------------- Targeted Anatomy  Thorax  3 V Trachea View:      Appears normal ---------------------------------------------------------------------- Cervix Uterus Adnexa  Cervix  Length:            3.4  cm.  Normal appearance by transabdominal scan.  Uterus  No abnormality visualized.  Right Ovary  Not visualized.  Left Ovary  Size(cm)     3.42   x   2.41   x  1.87      Vol(ml): 8.07  Within normal limits.  Cul De Sac  No free fluid seen.  Adnexa  No adnexal mass visualized. ---------------------------------------------------------------------- Impression  Patient returned for completion of fetal anatomy .Amniotic  fluid is normal and good fetal activity is seen .Fetal biometry  is consistent with her previously-established dates .Fetal  anatomical survey was completed and appears normal. ---------------------------------------------------------------------- Recommendations  Follow-up scans as clinically indicated. ----------------------------------------------------------------------                 Noralee Space, MD Electronically Signed Final Report   06/07/2022 01:53  pm ----------------------------------------------------------------------  [place] MFM OB DETAIL +14 WK  Result Date: 05/12/2022 ----------------------------------------------------------------------  OBSTETRICS REPORT                       (Signed Final 05/12/2022 04:14 pm) ---------------------------------------------------------------------- Patient Info  ID #:       [id number]                           D.O.B.:  [birth year] (26 yrs)  Name:       Jill Schwartz               Visit Date: 05/12/2022 02:32 pm ---------------------------------------------------------------------- Performed By  Attending:        Noralee Space MD        Ref. Address:     [address]  Performed By:     Eden Lathe BS      Location[facility] RVT                                 [facility]  Referred By:      [facility] [place] ---------------------------------------------------------------------- Orders  #  Description                           Code        Ordered By  1  [place] MFM OB DETAIL +14 WK               [id number]    Candelaria Celeste ----------------------------------------------------------------------  #  Order #                     Accession #                Episode #  1  [id number]                   [id number]                 [id number] ---------------------------------------------------------------------- Indications  Obesity complicating pregnancy, second         O99.212  trimester (pregravid BMI 30.45)  [redacted] weeks gestation of pregnancy                Z3A.18  Antenatal screening for malformations          Z36.3  Low risk NIPS, neg Horizon ---------------------------------------------------------------------- Fetal Evaluation  Num Of Fetuses:         1  Fetal Heart Rate(bpm):  154  Cardiac Activity:       Observed  Presentation:           Cephalic  Placenta:               Anterior  P. Cord Insertion:      Visualized  Amniotic Fluid  AFI FV:      Within normal limits                              Largest Pocket(cm)                              4.9 ---------------------------------------------------------------------- Biometry  BPD:      42.4  mm     G. Age:  18w 6d         56  %    CI:         76.57   %    70 - 86                                                          FL/HC:      17.9   %    16.1 - 18.3  HC:      153.5  mm     G. Age:  18w 2d         24  %    HC/AC:      1.13        1.09 - 1.39  AC:      135.8  mm     G. Age:  19w 0d         57  %    FL/BPD:     64.8   %  FL:      27.49  mm     G. Age:  18w 3d         32  %    FL/AC:      20.2   %    20 - 24  HUM:      27.6  mm     G. Age:  18w 6d         55  %  CER:      18.9  mm     G. Age:  18w 4d         32  %  NFT:       2.8  mm  LV:        5.6  mm  CM:        3.5  mm  Est. FW:     253  gm      0 lb 9 oz     44  % ---------------------------------------------------------------------- OB History  Gravidity:    1  Term:   0        Prem:   0        SAB:   0  TOP:          0       Ectopic:  0        Living: 0 ---------------------------------------------------------------------- Gestational Age  LMP:           18w 5d        Date:  01/01/22                  EDD:   10/08/22  U/S Today:     18w 5d                                        EDD:   10/08/22  Best:          18w 5d     Det. By:  LMP  (01/01/22)          EDD:   10/08/22 ---------------------------------------------------------------------- Anatomy  Cranium:               Appears normal         Aortic Arch:            Appears normal  Cavum:                 Appears normal         Ductal Arch:            Appears normal  Ventricles:            Appears normal         Diaphragm:              Appears normal  Choroid Plexus:        Appears normal         Stomach:                Appears normal, left                                                                        sided  Cerebellum:            Appears normal         Abdomen:                Appears normal  Posterior Fossa:       Appears normal         Abdominal Wall:         Appears nml (cord                                                                        insert, abd wall)  Nuchal Fold:           Appears normal         Cord Vessels:  Appears normal (3                                                                        vessel cord)  Face:                  Appears normal         Kidneys:                Appear normal                         (orbits and profile)  Lips:                  Appears normal         Bladder:                Appears normal  Thoracic:              Appears normal         Spine:                  Appears normal  Heart:                 Not well visualized    Upper Extremities:      Appears normal  RVOT:                  Not well visualized    Lower Extremities:      Appears normal  LVOT:                  Appears normal  Other:  Feet/Left heel, open hands/5th digits, 3VV, VC, and nasal bone          visualized. Fetus appears to be a female. Technically difficult due to          fetal position. ---------------------------------------------------------------------- Cervix Uterus Adnexa  Cervix  Length:            3.5  cm.  Normal appearance by transabdominal scan.  Uterus  No abnormality visualized.  Right Ovary  Not visualized.  Left Ovary  Not visualized.  Cul De Sac  No free fluid seen.  Adnexa  No abnormality visualized. ---------------------------------------------------------------------- Impression  G1 P0. Patient is here for fetal anatomy scan .  On cell-free fetal DNA screening, the risks of fetal  aneuploidies are not increased .  We performed fetal anatomy scan. No makers of  aneuploidies or fetal structural defects are seen. Fetal  biometry is consistent with her previously-established dates.  Amniotic fluid is normal and good fetal activity is seen.  Patient understands the limitations of ultrasound in detecting  fetal anomalies. ---------------------------------------------------------------------- Recommendations  -An appointment was made for her to return in 4 weeks for  completion of fetal anatomy. ----------------------------------------------------------------------                 Noralee Space, MD  Electronically Signed Final Report   05/12/2022 04:14 pm ----------------------------------------------------------------------   Assessment and Plan:  Pregnancy: G1P0 at [redacted]w[redacted]d 1. [redacted] weeks gestation of pregnancy 2. Supervision of normal first pregnancy, antepartum Normal anatomy scan. Doing well. No concerns. Preterm labor symptoms and general obstetric  precautions including but not limited to vaginal bleeding, contractions, leaking of fluid and fetal movement were reviewed in detail with the patient. Please refer to After Visit Summary for other counseling recommendations.   Return in about 4 weeks (around 07/07/2022) for OFFICE OB VISIT (MD or APP).  Future Appointments  Date Time Provider Department Center  07/07/2022  8:15 AM Levie HeritageStinson, Jacob J, DO [facility] None  07/20/2022  8:15 AM Levie HeritageStinson, Jacob J, DO [facility] None  08/03/2022  8:15 AM Adrian BlackwaterStinson, Rhona RaiderJacob J, DO [facility] None    Jaynie CollinsUgonna Calahan Pak, MD

## 2022-06-26 ENCOUNTER — Telehealth: Payer: Self-pay

## 2022-06-26 NOTE — Telephone Encounter (Signed)
Patient called the after hours nurse line stating she is constipated and if feels like it's stuck.   Called pt back. Patient states she used a suppository and it provided relief. Advised pt to take any other the medications that are listed on her medication list. Also advised pt to increase fiber in her diet by eating more green leafy vegetables and increase water intake. Understanding was voiced. Zipporah Finamore l Santiesteban, CMA

## 2022-06-27 ENCOUNTER — Encounter: Payer: Self-pay | Admitting: Family Medicine

## 2022-06-30 ENCOUNTER — Ambulatory Visit (INDEPENDENT_AMBULATORY_CARE_PROVIDER_SITE_OTHER): Payer: Medicaid Other

## 2022-06-30 ENCOUNTER — Ambulatory Visit: Payer: Medicaid Other

## 2022-06-30 ENCOUNTER — Other Ambulatory Visit (HOSPITAL_COMMUNITY)
Admission: RE | Admit: 2022-06-30 | Discharge: 2022-06-30 | Disposition: A | Discharge status: Home / Self Care | Payer: Medicaid Other | Source: Ambulatory Visit | Attending: Family Medicine | Admitting: Family Medicine

## 2022-06-30 VITALS — BP 107/69 | HR 87 | Wt 181.0 lb

## 2022-06-30 DIAGNOSIS — Z113 Encounter for screening for infections with a predominantly sexual mode of transmission: Secondary | ICD-10-CM

## 2022-06-30 DIAGNOSIS — N898 Other specified noninflammatory disorders of vagina: Secondary | ICD-10-CM

## 2022-06-30 DIAGNOSIS — Z3A25 25 weeks gestation of pregnancy: Secondary | ICD-10-CM

## 2022-06-30 NOTE — Progress Notes (Deleted)
SUBJECTIVE:  26 y.o. female complains of white vaginal discharge with odor for 4 day(s). Denies abnormal vaginal bleeding or significant pelvic pain or fever. No UTI symptoms. Denies history of known exposure to STD.  Patient's last menstrual period was 01/01/2022.  OBJECTIVE:  She appears well, afebrile. Urine dipstick: not done.  ASSESSMENT:  Vaginal Discharge  Vaginal Odor   PLAN:  BVAG, CVAG probe sent to lab. Treatment: To be determined once lab results are received ROV prn if symptoms persist or worsen.

## 2022-06-30 NOTE — Progress Notes (Unsigned)
SUBJECTIVE:  26 y.o. female complains of white vaginal discharge for 4 day(s). Denies abnormal vaginal bleeding or significant pelvic pain or fever. No UTI symptoms. Denies history of known exposure to STD.  Patient's last menstrual period was 01/01/2022.  OBJECTIVE:  She appears well, afebrile. Urine dipstick: not done.  ASSESSMENT:  Vaginal Discharge  Vaginal Odor   PLAN:  BVAG, CVAG probe sent to lab. Treatment: To be determined once lab results are received ROV prn if symptoms persist or worsen.   Kailene Steinhart l Molesky, CMA

## 2022-07-03 ENCOUNTER — Telehealth: Payer: Self-pay

## 2022-07-03 DIAGNOSIS — B379 Candidiasis, unspecified: Secondary | ICD-10-CM

## 2022-07-03 LAB — CERVICOVAGINAL ANCILLARY ONLY
Bacterial Vaginitis (gardnerella): NEGATIVE
Candida Glabrata: NEGATIVE
Candida Vaginitis: POSITIVE — AB
Comment: NEGATIVE
Comment: NEGATIVE
Comment: NEGATIVE

## 2022-07-03 MED ORDER — TERCONAZOLE 0.4 % VA CREA: TOPICAL_CREAM | VAGINAL | 0 refills | Status: AC

## 2022-07-03 NOTE — Telephone Encounter (Signed)
Called patient to inform her that she has a yeast infection. Terconazole 1 Applicator QHS x 3 days was sent to her pharmacy. Understanding was voiced. Shyleigh Daughtry l Vangorden, CMA

## 2022-07-07 ENCOUNTER — Ambulatory Visit (INDEPENDENT_AMBULATORY_CARE_PROVIDER_SITE_OTHER): Payer: Medicaid Other | Admitting: Family Medicine

## 2022-07-07 VITALS — BP 108/63 | HR 96 | Wt 181.0 lb

## 2022-07-07 DIAGNOSIS — F419 Anxiety disorder, unspecified: Secondary | ICD-10-CM

## 2022-07-07 DIAGNOSIS — Z3A26 26 weeks gestation of pregnancy: Secondary | ICD-10-CM

## 2022-07-07 DIAGNOSIS — Z3400 Encounter for supervision of normal first pregnancy, unspecified trimester: Secondary | ICD-10-CM

## 2022-07-07 DIAGNOSIS — Z6830 Body mass index (BMI) 30.0-30.9, adult: Secondary | ICD-10-CM

## 2022-07-07 NOTE — Progress Notes (Signed)
   PRENATAL VISIT NOTE  Subjective:  Jill Schwartz is a 26 y.o. G1P0 at [redacted]w[redacted]d being seen today for ongoing prenatal care.  She is currently monitored for the following issues for this low-risk pregnancy and has Supervision of normal first pregnancy, antepartum; Anxiety; and BMI 30.0-30.9,adult on their problem list.  Patient reports no complaints.  Contractions: Not present. Vag. Bleeding: None.  Movement: Present. Denies leaking of fluid.   The following portions of the patient's history were reviewed and updated as appropriate: allergies, current medications, past family history, past medical history, past social history, past surgical history and problem list.   Objective:   Vitals:   07/07/22 0821  BP: 108/63  Pulse: 96  Weight: 181 lb (82.1 kg)    Fetal Status: Fetal Heart Rate (bpm): 144   Movement: Present     General:  Alert, oriented and cooperative. Patient is in no acute distress.  Skin: Skin is warm and dry. No rash noted.   Breast: Chaperone offered and declined. Has "lollipop" incision on breast.  Cardiovascular: Normal heart rate noted  Respiratory: Normal respiratory effort, no problems with respiration noted  Abdomen: Soft, gravid, appropriate for gestational age.  Pain/Pressure: Absent     Pelvic: Cervical exam deferred        Extremities: Normal range of motion.  Edema: None  Mental Status: Normal mood and affect. Normal behavior. Normal judgment and thought content.   Assessment and Plan:  Pregnancy: G1P0 at [redacted]w[redacted]d 1. [redacted] weeks gestation of pregnancy  2. Supervision of normal first pregnancy, antepartum FHT and FH normal Patient had a "lollipop" breast reduction. Has good sensation to the nipple. Nipple starting to protrude. Should be able to breastfeed. Will have to watch for recurrent "clogged ducts" and mastitis if part of breast ducts removed.  3. Anxiety  4. BMI 30.0-30.9,adult TWG - -2# from pregravid weight.  Preterm labor symptoms and general  obstetric precautions including but not limited to vaginal bleeding, contractions, leaking of fluid and fetal movement were reviewed in detail with the patient. Please refer to After Visit Summary for other counseling recommendations.   No follow-ups on file.  Future Appointments  Date Time Provider Department Center  07/20/2022  8:15 AM Levie Heritage, DO [facility] None  08/03/2022  8:15 AM Adrian Blackwater Rhona Raider, DO [facility] None    Levie Heritage, DO

## 2022-07-12 NOTE — Progress Notes (Signed)
This encounter was created in error - please disregard.

## 2022-07-19 NOTE — Progress Notes (Signed)
Order(s) created erroneously. Erroneous order ID: [id number]  Order moved by: HONEYCUTT, LAUREN E  Order move date/time: 07/19/2022 4:38 PM  Source Patient: [name]  Source Contact: 06/30/2022  Destination Patient: [name]  Destination Contact: 06/30/2022 

## 2022-07-19 NOTE — Progress Notes (Signed)
Order(s) created erroneously. Erroneous order ID: [id number]  Order moved by: Orpah Clinton  Order move date/time: 07/19/2022 4:38 PM  Source Patient: [id number]  Source Contact: 06/30/2022  Destination Patient: [id number]  Destination Contact: 06/30/2022

## 2022-07-20 ENCOUNTER — Ambulatory Visit (INDEPENDENT_AMBULATORY_CARE_PROVIDER_SITE_OTHER): Payer: Medicaid Other | Admitting: Family Medicine

## 2022-07-20 ENCOUNTER — Encounter: Payer: Self-pay | Admitting: General Practice

## 2022-07-20 VITALS — BP 99/63 | HR 88 | Wt 181.0 lb

## 2022-07-20 DIAGNOSIS — Z6830 Body mass index (BMI) 30.0-30.9, adult: Secondary | ICD-10-CM

## 2022-07-20 DIAGNOSIS — Z23 Encounter for immunization: Secondary | ICD-10-CM

## 2022-07-20 DIAGNOSIS — Z3403 Encounter for supervision of normal first pregnancy, third trimester: Secondary | ICD-10-CM | POA: Diagnosis not present

## 2022-07-20 DIAGNOSIS — Z3400 Encounter for supervision of normal first pregnancy, unspecified trimester: Secondary | ICD-10-CM

## 2022-07-20 DIAGNOSIS — Z3A28 28 weeks gestation of pregnancy: Secondary | ICD-10-CM | POA: Diagnosis not present

## 2022-07-20 NOTE — Progress Notes (Signed)
   PRENATAL VISIT NOTE  Subjective:  Jill Schwartz is a 26 y.o. G1P0 at [redacted]w[redacted]d being seen today for ongoing prenatal care.  She is currently monitored for the following issues for this low-risk pregnancy and has Supervision of normal first pregnancy, antepartum; Anxiety; and BMI 30.0-30.9,adult on their problem list.  Patient reports no complaints.  Contractions: Not present. Vag. Bleeding: None.  Movement: Present. Denies leaking of fluid.   The following portions of the patient's history were reviewed and updated as appropriate: allergies, current medications, past family history, past medical history, past social history, past surgical history and problem list.   Objective:   Vitals:   07/20/22 0825  BP: 99/63  Pulse: 88  Weight: 181 lb (82.1 kg)    Fetal Status: Fetal Heart Rate (bpm): 133   Movement: Present     General:  Alert, oriented and cooperative. Patient is in no acute distress.  Skin: Skin is warm and dry. No rash noted.   Cardiovascular: Normal heart rate noted  Respiratory: Normal respiratory effort, no problems with respiration noted  Abdomen: Soft, gravid, appropriate for gestational age.  Pain/Pressure: Present     Pelvic: Cervical exam deferred        Extremities: Normal range of motion.  Edema: None  Mental Status: Normal mood and affect. Normal behavior. Normal judgment and thought content.   Assessment and Plan:  Pregnancy: G1P0 at [redacted]w[redacted]d 1. [redacted] weeks gestation of pregnancy - CBC - Glucose Tolerance, 2 Hours w/1 Hour - HIV Antibody (routine testing w rflx) - RPR - Tdap vaccine greater than or equal to 7yo IM  2. Supervision of normal first pregnancy, antepartum FHT and FH normal. Needs pediatrician. Thinking about contraception  3. BMI 30.0-30.9,adult   Preterm labor symptoms and general obstetric precautions including but not limited to vaginal bleeding, contractions, leaking of fluid and fetal movement were reviewed in detail with the  patient. Please refer to After Visit Summary for other counseling recommendations.   No follow-ups on file.  Future Appointments  Date Time Provider Department Center  08/03/2022  8:15 AM Levie Heritage, DO [facility] None  08/17/2022  9:55 AM Levie Heritage, DO [facility] None  08/31/2022  9:15 AM Levie Heritage, DO [facility] None  09/13/2022  8:35 AM Levie Heritage, DO [facility] None  09/21/2022  9:55 AM Levie Heritage, DO [facility] None  09/28/2022  9:55 AM Levie Heritage, DO [facility] None  10/04/2022  9:55 AM Lorriane Shire, MD [facility] None    Levie Heritage, DO

## 2022-07-21 LAB — CBC
Hematocrit: 32.7 % — ABNORMAL LOW (ref 34.0–46.6)
Hemoglobin: 10.9 g/dL — ABNORMAL LOW (ref 11.1–15.9)
MCH: 29.7 pg (ref 26.6–33.0)
MCHC: 33.3 g/dL (ref 31.5–35.7)
MCV: 89 fL (ref 79–97)
Platelets: 343 10*3/uL (ref 150–450)
RBC: 3.67 x10E6/uL — ABNORMAL LOW (ref 3.77–5.28)
RDW: 13.3 % (ref 11.7–15.4)
WBC: 5.6 10*3/uL (ref 3.4–10.8)

## 2022-07-21 LAB — GLUCOSE TOLERANCE, 2 HOURS W/ 1HR
Glucose, 1 hour: 139 mg/dL (ref 70–179)
Glucose, 2 hour: 117 mg/dL (ref 70–152)
Glucose, Fasting: 79 mg/dL (ref 70–91)

## 2022-07-21 LAB — HIV ANTIBODY (ROUTINE TESTING W REFLEX): HIV Screen 4th Generation wRfx: NONREACTIVE

## 2022-07-21 LAB — RPR: RPR Ser Ql: NONREACTIVE

## 2022-07-24 ENCOUNTER — Encounter (HOSPITAL_COMMUNITY): Payer: Self-pay | Admitting: Obstetrics & Gynecology

## 2022-07-24 ENCOUNTER — Inpatient Hospital Stay (HOSPITAL_COMMUNITY)
Admission: AD | Admit: 2022-07-24 | Discharge: 2022-07-24 | Disposition: A | Discharge status: Home / Self Care | Payer: Medicaid Other | Source: Home / Self Care | Attending: Obstetrics & Gynecology | Admitting: Obstetrics & Gynecology

## 2022-07-24 ENCOUNTER — Other Ambulatory Visit: Payer: Self-pay

## 2022-07-24 ENCOUNTER — Inpatient Hospital Stay (HOSPITAL_COMMUNITY)
Admission: AD | Admit: 2022-07-24 | Discharge: 2022-07-24 | Disposition: A | Discharge status: Home / Self Care | Payer: Medicaid Other | Attending: Obstetrics & Gynecology | Admitting: Obstetrics & Gynecology

## 2022-07-24 ENCOUNTER — Telehealth: Payer: Self-pay | Admitting: General Practice

## 2022-07-24 DIAGNOSIS — R109 Unspecified abdominal pain: Secondary | ICD-10-CM | POA: Insufficient documentation

## 2022-07-24 DIAGNOSIS — Z3A29 29 weeks gestation of pregnancy: Secondary | ICD-10-CM | POA: Insufficient documentation

## 2022-07-24 DIAGNOSIS — R309 Painful micturition, unspecified: Secondary | ICD-10-CM | POA: Insufficient documentation

## 2022-07-24 DIAGNOSIS — O26893 Other specified pregnancy related conditions, third trimester: Secondary | ICD-10-CM | POA: Insufficient documentation

## 2022-07-24 DIAGNOSIS — Y848 Other medical procedures as the cause of abnormal reaction of the patient, or of later complication, without mention of misadventure at the time of the procedure: Secondary | ICD-10-CM | POA: Insufficient documentation

## 2022-07-24 DIAGNOSIS — Z0371 Encounter for suspected problem with amniotic cavity and membrane ruled out: Secondary | ICD-10-CM | POA: Diagnosis not present

## 2022-07-24 DIAGNOSIS — O26853 Spotting complicating pregnancy, third trimester: Secondary | ICD-10-CM | POA: Insufficient documentation

## 2022-07-24 DIAGNOSIS — N920 Excessive and frequent menstruation with regular cycle: Secondary | ICD-10-CM

## 2022-07-24 DIAGNOSIS — S3730XA Unspecified injury of urethra, initial encounter: Secondary | ICD-10-CM | POA: Insufficient documentation

## 2022-07-24 DIAGNOSIS — O9A213 Injury, poisoning and certain other consequences of external causes complicating pregnancy, third trimester: Secondary | ICD-10-CM | POA: Insufficient documentation

## 2022-07-24 DIAGNOSIS — R300 Dysuria: Secondary | ICD-10-CM

## 2022-07-24 LAB — URINALYSIS, ROUTINE W REFLEX MICROSCOPIC
Bilirubin Urine: NEGATIVE
Bilirubin Urine: NEGATIVE
Glucose, UA: NEGATIVE mg/dL
Glucose, UA: NEGATIVE mg/dL
Hgb urine dipstick: NEGATIVE
Ketones, ur: NEGATIVE mg/dL
Ketones, ur: NEGATIVE mg/dL
Leukocytes,Ua: NEGATIVE
Nitrite: NEGATIVE
Nitrite: NEGATIVE
Protein, ur: NEGATIVE mg/dL
Protein, ur: 30 mg/dL — AB
RBC / HPF: >50 RBC/hpf — ABNORMAL HIGH (ref 0–5)
Specific Gravity, Urine: 1.021 (ref 1.005–1.030)
Specific Gravity, Urine: 1.025 (ref 1.005–1.030)
pH: 6 (ref 5.0–8.0)
pH: 5 (ref 5.0–8.0)

## 2022-07-24 LAB — WET PREP, GENITAL
Clue Cells Wet Prep HPF POC: NONE SEEN
Sperm: NONE SEEN
Trich, Wet Prep: NONE SEEN
WBC, Wet Prep HPF POC: >=10 — AB (ref <10)
Yeast Wet Prep HPF POC: NONE SEEN

## 2022-07-24 LAB — AMNISURE RUPTURE OF MEMBRANE (ROM) NOT AT ARMC: Amnisure ROM: NEGATIVE

## 2022-07-24 MED ORDER — LIDOCAINE HCL URETHRAL/MUCOSAL 2 % EX GEL: 1.0000 | CUTANEOUS | 0 refills | Status: AC | PRN

## 2022-07-24 MED ORDER — LIDOCAINE HCL URETHRAL/MUCOSAL 2 % EX GEL
1.0000 | Freq: Once | CUTANEOUS | Status: AC
  Administered 2022-07-24: 1 via TOPICAL
  Filled 2022-07-24: qty 6

## 2022-07-24 NOTE — MAU Note (Signed)
Jill Schwartz is a 26 y.o. at [redacted]w[redacted]d here in MAU reporting: been having really sharp pains since 0500.  Had a gush of clear fluid around 0630, no leaking since. Denies recent intercourse. No bleeding.  Reports +FM  Onset of complaint: 0500/0630 Pain score: 8 Vitals:   07/24/22 1237  BP: 115/73  Pulse: 100  Resp: 17  Temp: 98.1 F (36.7 C)  SpO2: 99%     FHT:144 Lab orders placed from triage:  urine

## 2022-07-24 NOTE — MAU Note (Signed)
Pt says she was here at 11am- bc had a gush of liquid and severe cramps-  VE- closed  At 3pm- went to b-room- saw pink on tissue - while urinating - had burning  Then at 7 pm- saw a lot of blood on tissue and toilet  No pad on now- and no blood in underwear  No pain - no cramping

## 2022-07-24 NOTE — Telephone Encounter (Signed)
Pt called c/o sharp pain and leaking fluid and was very tearful.  Consulted with RN and recommended that she go to MAU for further evaluation.  Pt stated that she may need to call EMS.  Advised pt that if it's emergent to please call 911.  Pt verbalized understanding.

## 2022-07-24 NOTE — Discharge Instructions (Signed)

## 2022-07-24 NOTE — MAU Provider Note (Signed)
History     CSN: [id number]  Arrival date and time: 07/24/22 1222   Event Date/Time   First Provider Initiated Contact with Patient 07/24/22 1304      Chief Complaint  Patient presents with   Abdominal Pain   Rupture of Membranes   HPI  Jill Schwartz is a 26 y.o. G1P0 at [redacted]w[redacted]d who presents for evaluation of possible rupture of membranes. Patient reports at 0630 she had a gush of clear fluid in the bed. She has not had any since. She also reports some intermittent cramping when that happened which has since resolved. She denies any pain at this time.  She denies any vaginal bleeding. Denies any constipation, diarrhea or any urinary complaints. Reports normal fetal movement.   OB History     Gravida  1   Para      Term      Preterm      AB      Living         SAB      IAB      Ectopic      Multiple      Live Births              Past Medical History:  Diagnosis Date   Anxiety    Attention deficit disorder    Depression    Family history of adverse reaction to anesthesia    Family history of malignant hyperthermia   Headache    Macromastia    Bilateral   Shoulder dislocation     Past Surgical History:  Procedure Laterality Date   BREAST REDUCTION SURGERY Bilateral 03/01/2018   Procedure: BILATERAL MAMMARY REDUCTION  (BREAST);  Surgeon: Glenna Fellows, MD;  Location: [facility] OR;  Service: Plastics;  Laterality: Bilateral;   BUTTOCK LIFT  08/2019   SHOULDER SURGERY Right     Family History  Problem Relation Age of Onset   Diabetes Maternal Aunt    Hypertension Maternal Aunt    Hypertension Maternal Grandmother    Stroke Maternal Grandfather    Hypertension Maternal Grandfather    Cancer Other    Obesity Neg Hx     Social History   Tobacco Use   Smoking status: Never   Smokeless tobacco: Never  Vaping Use   Vaping Use: Never used  Substance Use Topics   Alcohol use: No   Drug use: No    Allergies:  Allergies  Allergen  Reactions   Penicillins Hives and Other (See Comments)    Has patient had a PCN reaction causing immediate rash, facial/tongue/throat swelling, SOB or lightheadedness with hypotension: Yes Has patient had a PCN reaction causing severe rash involving mucus membranes or skin necrosis: No Has patient had a PCN reaction that required hospitalization: Yes Has patient had a PCN reaction occurring within the last 10 years: No If all of the above answers are "NO", then may proceed with Cephalosporin use.    Metformin And Related Other (See Comments)    Severe HAs    No medications prior to admission.    Review of Systems  Constitutional: Negative.  Negative for fatigue and fever.  HENT: Negative.    Respiratory: Negative.  Negative for shortness of breath.   Cardiovascular: Negative.  Negative for chest pain.  Gastrointestinal: Negative.  Negative for abdominal pain, constipation, diarrhea, nausea and vomiting.  Genitourinary:  Positive for vaginal discharge. Negative for dysuria and vaginal bleeding.  Neurological: Negative.  Negative for dizziness and headaches.  Physical Exam   Blood pressure 104/80, pulse 88, temperature 97.9 F (36.6 C), temperature source Oral, resp. rate 18, height 5\' 5"  (1.651 m), weight 82.9 kg, last menstrual period 01/01/2022, SpO2 99 %.  Patient Vitals for the past 24 hrs:  BP Temp Temp src Pulse Resp SpO2 Height Weight  07/24/22 1401 104/80 97.9 F (36.6 C) Oral 88 18 -- -- --  07/24/22 1355 -- -- -- -- -- 99 % -- --  07/24/22 1326 106/71 -- -- 78 -- -- -- --  07/24/22 1325 -- -- -- -- -- 99 % -- --  07/24/22 1300 110/76 -- -- (!) 108 -- 99 % -- --  07/24/22 1237 115/73 98.1 F (36.7 C) Oral 100 17 99 % 5\' 5"  (1.651 m) 82.9 kg    Physical Exam Vitals and nursing note reviewed.  Constitutional:      General: She is not in acute distress.    Appearance: She is well-developed.  HENT:     Head: Normocephalic.  Eyes:     Pupils: Pupils are equal,  round, and reactive to light.  Cardiovascular:     Rate and Rhythm: Normal rate and regular rhythm.     Heart sounds: Normal heart sounds.  Pulmonary:     Effort: Pulmonary effort is normal. No respiratory distress.     Breath sounds: Normal breath sounds.  Abdominal:     General: Bowel sounds are normal. There is no distension.     Palpations: Abdomen is soft.     Tenderness: There is no abdominal tenderness.  Genitourinary:    Comments: Pelvic exam: Cervix pink, visually closed, without lesion, scant white creamy discharge, vaginal walls and external genitalia normal Skin:    General: Skin is warm and dry.  Neurological:     Mental Status: She is alert and oriented to person, place, and time.  Psychiatric:        Mood and Affect: Mood normal.        Behavior: Behavior normal.        Thought Content: Thought content normal.        Judgment: Judgment normal.     Fetal Tracing:  Baseline: 135 Variability: moderate Accels: 10x10 Decels: none  Toco: none  Dilation: Closed Effacement (%): Thick Station: Ballotable   MAU Course  Procedures  Results for orders placed or performed during the hospital encounter of 07/24/22 (from the past 24 hour(s))  Amnisure rupture of membrane (rom)not at [facility]     Status: None   Collection Time: 07/24/22 12:56 PM  Result Value Ref Range   Amnisure ROM NEGATIVE   Wet prep, genital     Status: Abnormal   Collection Time: 07/24/22 12:56 PM   Specimen: Vaginal  Result Value Ref Range   Yeast Wet Prep HPF POC NONE SEEN NONE SEEN   Trich, Wet Prep NONE SEEN NONE SEEN   Clue Cells Wet Prep HPF POC NONE SEEN NONE SEEN   WBC, Wet Prep HPF POC >=10 (A) <10   Sperm NONE SEEN   Urinalysis, Routine w reflex microscopic Urine, Clean Catch     Status: Abnormal   Collection Time: 07/24/22 12:56 PM  Result Value Ref Range   Color, Urine YELLOW YELLOW   APPearance HAZY (A) CLEAR   Specific Gravity, Urine 1.021 1.005 - 1.030   pH 6.0 5.0 - 8.0    Glucose, UA NEGATIVE NEGATIVE mg/dL   Hgb urine dipstick NEGATIVE NEGATIVE   Bilirubin Urine NEGATIVE NEGATIVE  Ketones, ur NEGATIVE NEGATIVE mg/dL   Protein, ur NEGATIVE NEGATIVE mg/dL   Nitrite NEGATIVE NEGATIVE   Leukocytes,Ua SMALL (A) NEGATIVE   RBC / HPF 6-10 0 - 5 RBC/hpf   WBC, UA 6-10 0 - 5 WBC/hpf   Bacteria, UA FEW (A) NONE SEEN   Squamous Epithelial / LPF 6-10 0 - 5   Mucus PRESENT       MDM Labs ordered and reviewed.   UA Wet prep  Amnisure  Assessment and Plan   1. Encounter for suspected premature rupture of amniotic membranes, with rupture of membranes not found   2. [redacted] weeks gestation of pregnancy     -Discharge home in stable condition -Third trimester precautions discussed -Patient advised to follow-up with OB as scheduled for prenatal care -Patient may return to MAU as needed or if her condition were to change or worsen  Rolm Bookbinder, CNM 07/24/2022, 2:16 PM

## 2022-07-24 NOTE — MAU Provider Note (Signed)
  History     CSN: [id number]  Arrival date and time: 07/24/22 1947   Event Date/Time   First Provider Initiated Contact with Patient 07/24/22 2029      Chief Complaint  Patient presents with   Vaginal Bleeding   HPI  {GYN/OB [id number]}  Past Medical History:  Diagnosis Date   Anxiety    Attention deficit disorder    Depression    Family history of adverse reaction to anesthesia    Family history of malignant hyperthermia   Headache    Macromastia    Bilateral   Shoulder dislocation     Past Surgical History:  Procedure Laterality Date   BREAST REDUCTION SURGERY Bilateral 03/01/2018   Procedure: BILATERAL MAMMARY REDUCTION  (BREAST);  Surgeon: Glenna Fellows, MD;  Location: [facility] OR;  Service: Plastics;  Laterality: Bilateral;   BUTTOCK LIFT  08/2019   SHOULDER SURGERY Right     Family History  Problem Relation Age of Onset   Diabetes Maternal Aunt    Hypertension Maternal Aunt    Hypertension Maternal Grandmother    Stroke Maternal Grandfather    Hypertension Maternal Grandfather    Cancer Other    Obesity Neg Hx     Social History   Tobacco Use   Smoking status: Never   Smokeless tobacco: Never  Vaping Use   Vaping Use: Never used  Substance Use Topics   Alcohol use: No   Drug use: No    Allergies:  Allergies  Allergen Reactions   Penicillins Hives and Other (See Comments)    Has patient had a PCN reaction causing immediate rash, facial/tongue/throat swelling, SOB or lightheadedness with hypotension: Yes Has patient had a PCN reaction causing severe rash involving mucus membranes or skin necrosis: No Has patient had a PCN reaction that required hospitalization: Yes Has patient had a PCN reaction occurring within the last 10 years: No If all of the above answers are "NO", then may proceed with Cephalosporin use.    Metformin And Related Other (See Comments)    Severe HAs    Medications Prior to Admission  Medication Sig Dispense Refill Last  Dose   diphenhydrAMINE (BENADRYL) 25 MG tablet Take 25 mg by mouth every 6 (six) hours as needed.      Prenatal Vit-Fe Fumarate-FA (PRENATAL VITAMINS) 28-0.8 MG TABS Take 1 tablet by mouth daily. 30 tablet 11    terconazole (TERAZOL 7) 0.4 % vaginal cream Insert 1 applicator intravaginally every night for 3 days. 45 g 0     Review of Systems Physical Exam   Blood pressure 108/69, pulse 97, temperature 98.1 F (36.7 C), temperature source Oral, resp. rate 20, height 5\' 5"  (1.651 m), weight 82.8 kg, last menstrual period 01/01/2022.  Physical Exam  MAU Course  Procedures  MDM *** 135 moderate variability with accels no decels, Toco quiet and appropriate for gestational age.   Assessment and Plan  ***  01/03/2022 07/24/2022, 8:29 PM

## 2022-07-25 ENCOUNTER — Ambulatory Visit (INDEPENDENT_AMBULATORY_CARE_PROVIDER_SITE_OTHER): Payer: Medicaid Other

## 2022-07-25 VITALS — BP 98/57 | HR 90 | Wt 180.0 lb

## 2022-07-25 DIAGNOSIS — R300 Dysuria: Secondary | ICD-10-CM

## 2022-07-25 DIAGNOSIS — Z3A29 29 weeks gestation of pregnancy: Secondary | ICD-10-CM

## 2022-07-25 DIAGNOSIS — S3730XD Unspecified injury of urethra, subsequent encounter: Secondary | ICD-10-CM

## 2022-07-25 DIAGNOSIS — Z3403 Encounter for supervision of normal first pregnancy, third trimester: Secondary | ICD-10-CM

## 2022-07-25 DIAGNOSIS — Z3400 Encounter for supervision of normal first pregnancy, unspecified trimester: Secondary | ICD-10-CM

## 2022-07-25 NOTE — Progress Notes (Signed)
LOW-RISK PREGNANCY OFFICE VISIT  Patient name: Jill Schwartz MRN [id number]  Date of birth: 1996/07/02 Chief Complaint:   No chief complaint on file.  Subjective:   Jill Schwartz is a 26 y.o. G1P0 female at [redacted]w[redacted]d with an Estimated Date of Delivery: 10/08/22 being seen today for ongoing management of a low-risk pregnancy aeb has Supervision of normal first pregnancy, antepartum; Anxiety; and BMI 30.0-30.9,adult on their problem list.  Patient presents today with  c/o blood in urine d/t swab being placed in urethra .  Patient reports she was seen in the MAU yesterday for suspected LOF and the swab was placed in her urethra.  She states since that time she has been experiencing blood and burning sensation with urination.  She does admit that the burning has resolved with usage of lidocaine. However, she reports continued left sided shooting/stabbing pain that starts in her pelvis and radiates to her hip.  She rates the pain a 6/10 and it has been unrelieved with tylenol. Patient also expresses frustration with experience and questions how she can avoid delivery at [facility]. Otherwise, patient endorses fetal movement. Patient denies abdominal cramping or contractions.  Patient denies vaginal concerns including abnormal discharge, leaking of fluid, and bleeding.  Contractions: Not present. Vag. Bleeding: None.  Movement: Present.  Reviewed past medical,surgical, social, obstetrical and family history as well as problem list, medications and allergies.  Objective   Vitals:   07/25/22 0902  BP: (!) 98/57  Pulse: 90  Weight: 180 lb (81.6 kg)  Body mass index is 29.95 kg/m.  Total Weight Gain:-3 lb (-1.361 kg)         Physical Examination:   General appearance: Well appearing, and in no distress  Mental status: Alert, oriented to person, place, and time  Skin: Warm & dry  Cardiovascular: Normal heart rate noted  Respiratory: Normal respiratory effort, no distress  Abdomen: Soft, gravid,  nontender, AGA with Fundal Height: 31 cm  Pelvic: Cervical exam deferred  Visual exam reveals introitus with discharge likely d/t lidocaine jelly usage.  Urethra without redness or swelling. No apparent trauma.           Extremities: Edema: None  Fetal Status: Fetal Heart Rate (bpm): 143  Movement: Present   Results for orders placed or performed during the hospital encounter of 07/24/22 (from the past 24 hour(s))  Urinalysis, Routine w reflex microscopic Urine, Clean Catch   Collection Time: 07/24/22  8:33 PM  Result Value Ref Range   Color, Urine YELLOW YELLOW   APPearance HAZY (A) CLEAR   Specific Gravity, Urine 1.025 1.005 - 1.030   pH 5.0 5.0 - 8.0   Glucose, UA NEGATIVE NEGATIVE mg/dL   Hgb urine dipstick LARGE (A) NEGATIVE   Bilirubin Urine NEGATIVE NEGATIVE   Ketones, ur NEGATIVE NEGATIVE mg/dL   Protein, ur 30 (A) NEGATIVE mg/dL   Nitrite NEGATIVE NEGATIVE   Leukocytes,Ua NEGATIVE NEGATIVE   RBC / HPF >50 (H) 0 - 5 RBC/hpf   WBC, UA 0-5 0 - 5 WBC/hpf   Bacteria, UA RARE (A) NONE SEEN   Squamous Epithelial / LPF 0-5 0 - 5   Mucus PRESENT   Results for orders placed or performed during the hospital encounter of 07/24/22 (from the past 24 hour(s))  Wet prep, genital   Collection Time: 07/24/22 12:56 PM   Specimen: Vaginal  Result Value Ref Range   Yeast Wet Prep HPF POC NONE SEEN NONE SEEN   Trich, Wet Prep NONE SEEN  NONE SEEN   Clue Cells Wet Prep HPF POC NONE SEEN NONE SEEN   WBC, Wet Prep HPF POC >=10 (A) <10   Sperm NONE SEEN   Amnisure rupture of membrane (rom)not at [facility]   Collection Time: 07/24/22 12:56 PM  Result Value Ref Range   Amnisure ROM NEGATIVE   Urinalysis, Routine w reflex microscopic Urine, Clean Catch   Collection Time: 07/24/22 12:56 PM  Result Value Ref Range   Color, Urine YELLOW YELLOW   APPearance HAZY (A) CLEAR   Specific Gravity, Urine 1.021 1.005 - 1.030   pH 6.0 5.0 - 8.0   Glucose, UA NEGATIVE NEGATIVE mg/dL   Hgb urine dipstick  NEGATIVE NEGATIVE   Bilirubin Urine NEGATIVE NEGATIVE   Ketones, ur NEGATIVE NEGATIVE mg/dL   Protein, ur NEGATIVE NEGATIVE mg/dL   Nitrite NEGATIVE NEGATIVE   Leukocytes,Ua SMALL (A) NEGATIVE   RBC / HPF 6-10 0 - 5 RBC/hpf   WBC, UA 6-10 0 - 5 WBC/hpf   Bacteria, UA FEW (A) NONE SEEN   Squamous Epithelial / LPF 6-10 0 - 5   Mucus PRESENT     Assessment & Plan:  Low-risk pregnancy of a 26 y.o., G1P0 at [redacted]w[redacted]d with an Estimated Date of Delivery: 10/08/22   1. Supervision of normal first pregnancy, antepartum -Anticipatory guidance for upcoming appts. -Patient to schedule next appt in 2-4 weeks for an in-person visit. -Reviewed FH findings and reassured normal.   2. [redacted] weeks gestation of pregnancy -Doing well overall with exception.  -Reviewed previous labs.   3. Burning with urination -Resolved.  -Instructed to monitor. -Unable to leave urine specimen for dip, but culture from 9/11 pending.   4. Urethral trauma, subsequent encounter -Exam as above. -Instructed to continue usage of lidocaine jelly.  -Extensive discussion regarding patient experience. Supported in feelings of frustration and loss of trust surrounding this experience. Encouraged to embrace said feelings and discuss until she feels resolved. -Informed that only way to avoid delivery at [facility] would be transfer to another provider. Reviewed process. -Given information for patient experience and encouraged to call and discuss her experience and be open to resolution. -Encouraged to be her own patient advocate during future interactions in the healthcare environment.   -Patient seemed to be in improved spirits after conversation with provider.    Meds: No orders of the defined types were placed in this encounter.  Labs/procedures today:  Lab Orders  No laboratory test(s) ordered today     Reviewed: Preterm labor symptoms and general obstetric precautions including but not limited to vaginal bleeding, contractions,  leaking of fluid and fetal movement were reviewed in detail with the patient.  All questions were answered.  Follow-up: No follow-ups on file.  No orders of the defined types were placed in this encounter.  Cherre Robins MSN, CNM 07/25/2022

## 2022-07-26 LAB — CULTURE, OB URINE

## 2022-08-03 ENCOUNTER — Ambulatory Visit (INDEPENDENT_AMBULATORY_CARE_PROVIDER_SITE_OTHER): Payer: Medicaid Other | Admitting: Family Medicine

## 2022-08-03 VITALS — BP 101/65 | HR 102 | Wt 183.0 lb

## 2022-08-03 DIAGNOSIS — Z3403 Encounter for supervision of normal first pregnancy, third trimester: Secondary | ICD-10-CM

## 2022-08-03 DIAGNOSIS — Z3400 Encounter for supervision of normal first pregnancy, unspecified trimester: Secondary | ICD-10-CM

## 2022-08-03 DIAGNOSIS — Z3A30 30 weeks gestation of pregnancy: Secondary | ICD-10-CM

## 2022-08-03 DIAGNOSIS — Z6830 Body mass index (BMI) 30.0-30.9, adult: Secondary | ICD-10-CM

## 2022-08-03 NOTE — Progress Notes (Signed)
   PRENATAL VISIT NOTE  Subjective:  Jill Schwartz is a 26 y.o. G1P0 at [redacted]w[redacted]d being seen today for ongoing prenatal care.  She is currently monitored for the following issues for this low-risk pregnancy and has Supervision of normal first pregnancy, antepartum; Anxiety; and BMI 30.0-30.9,adult on their problem list.  Patient reports heartburn.  Contractions: Not present. Vag. Bleeding: None.  Movement: Present. Denies leaking of fluid.   The following portions of the patient's history were reviewed and updated as appropriate: allergies, current medications, past family history, past medical history, past social history, past surgical history and problem list.   Objective:   Vitals:   08/03/22 0817  BP: 101/65  Pulse: (!) 102  Weight: 183 lb (83 kg)    Fetal Status: Fetal Heart Rate (bpm): 134 Fundal Height: 32 cm Movement: Present     General:  Alert, oriented and cooperative. Patient is in no acute distress.  Skin: Skin is warm and dry. No rash noted.   Cardiovascular: Normal heart rate noted  Respiratory: Normal respiratory effort, no problems with respiration noted  Abdomen: Soft, gravid, appropriate for gestational age.  Pain/Pressure: Present     Pelvic: Cervical exam deferred        Extremities: Normal range of motion.  Edema: None  Mental Status: Normal mood and affect. Normal behavior. Normal judgment and thought content.   Assessment and Plan:  Pregnancy: G1P0 at [redacted]w[redacted]d 1. Supervision of normal first pregnancy, antepartum FHT and FH normal Planning on breast feeding Would like POP after delivery  2. BMI 30.0-30.9,adult  3. [redacted] weeks gestation of pregnancy   Preterm labor symptoms and general obstetric precautions including but not limited to vaginal bleeding, contractions, leaking of fluid and fetal movement were reviewed in detail with the patient. Please refer to After Visit Summary for other counseling recommendations.   No follow-ups on file.  Future  Appointments  Date Time Provider [place]  08/17/2022  9:55 AM Truett Mainland, DO [facility] None  08/31/2022  9:15 AM Truett Mainland, DO [facility] None  09/13/2022  8:35 AM Truett Mainland, DO [facility] None  09/21/2022  9:55 AM Truett Mainland, DO [facility] None  09/28/2022  9:55 AM Truett Mainland, DO [facility] None  10/04/2022  9:55 AM Darliss Cheney, MD [facility] None    Truett Mainland, DO

## 2022-08-17 ENCOUNTER — Ambulatory Visit (INDEPENDENT_AMBULATORY_CARE_PROVIDER_SITE_OTHER): Payer: Medicaid Other | Admitting: Family Medicine

## 2022-08-17 VITALS — BP 119/71 | HR 112 | Wt 184.0 lb

## 2022-08-17 DIAGNOSIS — Z3A32 32 weeks gestation of pregnancy: Secondary | ICD-10-CM

## 2022-08-17 DIAGNOSIS — Z3403 Encounter for supervision of normal first pregnancy, third trimester: Secondary | ICD-10-CM

## 2022-08-17 DIAGNOSIS — Z3400 Encounter for supervision of normal first pregnancy, unspecified trimester: Secondary | ICD-10-CM

## 2022-08-17 NOTE — Progress Notes (Signed)
   PRENATAL VISIT NOTE  Subjective:  Jill Schwartz is a 26 y.o. G1P0 at [redacted]w[redacted]d being seen today for ongoing prenatal care.  She is currently monitored for the following issues for this low-risk pregnancy and has Supervision of normal first pregnancy, antepartum; Anxiety; and BMI 30.0-30.9,adult on their problem list.  Patient reports  lower abdominal soreness .  Contractions: Not present. Vag. Bleeding: None.  Movement: Present. Denies leaking of fluid.   The following portions of the patient's history were reviewed and updated as appropriate: allergies, current medications, past family history, past medical history, past social history, past surgical history and problem list.   Objective:   Vitals:   08/17/22 1002  BP: 119/71  Pulse: (!) 112  Weight: 184 lb (83.5 kg)    Fetal Status: Fetal Heart Rate (bpm): 145   Movement: Present     General:  Alert, oriented and cooperative. Patient is in no acute distress.  Skin: Skin is warm and dry. No rash noted.   Cardiovascular: Normal heart rate noted  Respiratory: Normal respiratory effort, no problems with respiration noted  Abdomen: Soft, gravid, appropriate for gestational age.  Pain/Pressure: Present     Pelvic: Cervical exam deferred        Extremities: Normal range of motion.  Edema: None  Mental Status: Normal mood and affect. Normal behavior. Normal judgment and thought content.   Assessment and Plan:  Pregnancy: G1P0 at [redacted]w[redacted]d 1. [redacted] weeks gestation of pregnancy  2. Supervision of normal first pregnancy, antepartum FHT and FH normal  Preterm labor symptoms and general obstetric precautions including but not limited to vaginal bleeding, contractions, leaking of fluid and fetal movement were reviewed in detail with the patient. Please refer to After Visit Summary for other counseling recommendations.   No follow-ups on file.  Future Appointments  Date Time Provider [place]  08/31/2022  9:15 AM Truett Mainland,  DO [facility] None  09/13/2022  8:35 AM Truett Mainland, DO [facility] None  09/21/2022  9:55 AM Truett Mainland, DO [facility] None  09/28/2022  9:55 AM Truett Mainland, DO [facility] None  10/04/2022  9:55 AM Darliss Cheney, MD [facility] None    Truett Mainland, DO

## 2022-08-31 ENCOUNTER — Ambulatory Visit (INDEPENDENT_AMBULATORY_CARE_PROVIDER_SITE_OTHER): Payer: Medicaid Other | Admitting: Family Medicine

## 2022-08-31 VITALS — BP 107/77 | HR 102 | Wt 185.0 lb

## 2022-08-31 DIAGNOSIS — Z3400 Encounter for supervision of normal first pregnancy, unspecified trimester: Secondary | ICD-10-CM

## 2022-08-31 DIAGNOSIS — Z3A34 34 weeks gestation of pregnancy: Secondary | ICD-10-CM

## 2022-08-31 DIAGNOSIS — Z3403 Encounter for supervision of normal first pregnancy, third trimester: Secondary | ICD-10-CM

## 2022-08-31 DIAGNOSIS — Z6830 Body mass index (BMI) 30.0-30.9, adult: Secondary | ICD-10-CM

## 2022-08-31 DIAGNOSIS — F419 Anxiety disorder, unspecified: Secondary | ICD-10-CM

## 2022-08-31 NOTE — Progress Notes (Signed)
   PRENATAL VISIT NOTE  Subjective:  Jill Schwartz is a 26 y.o. G1P0 at [redacted]w[redacted]d being seen today for ongoing prenatal care.  She is currently monitored for the following issues for this low-risk pregnancy and has Supervision of normal first pregnancy, antepartum; Anxiety; and BMI 30.0-30.9,adult on their problem list.  Patient reports no complaints.  Contractions: Not present. Vag. Bleeding: None.  Movement: Present. Denies leaking of fluid.   The following portions of the patient's history were reviewed and updated as appropriate: allergies, current medications, past family history, past medical history, past social history, past surgical history and problem list.   Objective:   Vitals:   08/31/22 0926  BP: 107/77  Pulse: (!) 102  Weight: 185 lb (83.9 kg)    Fetal Status: Fetal Heart Rate (bpm): 153   Movement: Present     General:  Alert, oriented and cooperative. Patient is in no acute distress.  Skin: Skin is warm and dry. No rash noted.   Cardiovascular: Normal heart rate noted  Respiratory: Normal respiratory effort, no problems with respiration noted  Abdomen: Soft, gravid, appropriate for gestational age.  Pain/Pressure: Absent     Pelvic: Cervical exam deferred        Extremities: Normal range of motion.  Edema: None  Mental Status: Normal mood and affect. Normal behavior. Normal judgment and thought content.   Assessment and Plan:  Pregnancy: G1P0 at [redacted]w[redacted]d 1. [redacted] weeks gestation of pregnancy  2. Supervision of normal first pregnancy, antepartum FHT and FH normal  3. Anxiety  4. BMI 30.0-30.9,adult   Preterm labor symptoms and general obstetric precautions including but not limited to vaginal bleeding, contractions, leaking of fluid and fetal movement were reviewed in detail with the patient. Please refer to After Visit Summary for other counseling recommendations.   No follow-ups on file.  Future Appointments  Date Time Provider [place]  09/13/2022   8:35 AM Truett Mainland, DO [facility] None  09/21/2022  8:15 AM Truett Mainland, DO [facility] None  09/28/2022  8:15 AM Truett Mainland, DO [facility] None  10/04/2022  8:15 AM Darliss Cheney, MD [facility] None    Truett Mainland, DO

## 2022-09-13 ENCOUNTER — Other Ambulatory Visit: Payer: Self-pay | Admitting: Family Medicine

## 2022-09-13 ENCOUNTER — Ambulatory Visit (INDEPENDENT_AMBULATORY_CARE_PROVIDER_SITE_OTHER): Payer: Medicaid Other | Admitting: Family Medicine

## 2022-09-13 ENCOUNTER — Other Ambulatory Visit (HOSPITAL_COMMUNITY)
Admission: RE | Admit: 2022-09-13 | Discharge: 2022-09-13 | Disposition: A | Discharge status: Home / Self Care | Payer: Medicaid Other | Source: Ambulatory Visit | Attending: Family Medicine | Admitting: Family Medicine

## 2022-09-13 VITALS — BP 119/73 | HR 91 | Wt 188.0 lb

## 2022-09-13 DIAGNOSIS — Z3400 Encounter for supervision of normal first pregnancy, unspecified trimester: Secondary | ICD-10-CM

## 2022-09-13 DIAGNOSIS — Z3A36 36 weeks gestation of pregnancy: Secondary | ICD-10-CM | POA: Insufficient documentation

## 2022-09-13 DIAGNOSIS — Z6830 Body mass index (BMI) 30.0-30.9, adult: Secondary | ICD-10-CM

## 2022-09-13 DIAGNOSIS — F419 Anxiety disorder, unspecified: Secondary | ICD-10-CM

## 2022-09-13 NOTE — Progress Notes (Signed)
   PRENATAL VISIT NOTE  Subjective:  Jill Schwartz is a 26 y.o. G1P0 at [redacted]w[redacted]d being seen today for ongoing prenatal care.  She is currently monitored for the following issues for this low-risk pregnancy and has Supervision of normal first pregnancy, antepartum; Anxiety; and BMI 30.0-30.9,adult on their problem list.  Patient reports no complaints.  Contractions: Not present. Vag. Bleeding: None.  Movement: Present. Denies leaking of fluid.   The following portions of the patient's history were reviewed and updated as appropriate: allergies, current medications, past family history, past medical history, past social history, past surgical history and problem list.   Objective:   Vitals:   09/13/22 0854  BP: 119/73  Pulse: 91  Weight: 188 lb (85.3 kg)    Fetal Status: Fetal Heart Rate (bpm): 130   Movement: Present     General:  Alert, oriented and cooperative. Patient is in no acute distress.  Skin: Skin is warm and dry. No rash noted.   Cardiovascular: Normal heart rate noted  Respiratory: Normal respiratory effort, no problems with respiration noted  Abdomen: Soft, gravid, appropriate for gestational age.  Pain/Pressure: Absent     Pelvic: Cervical exam deferred        Extremities: Normal range of motion.  Edema: None  Mental Status: Normal mood and affect. Normal behavior. Normal judgment and thought content.   Assessment and Plan:  Pregnancy: G1P0 at [redacted]w[redacted]d 1. Supervision of normal first pregnancy, antepartum - Culture, beta strep (group b only) - GC/Chlamydia probe amp ([place])not at [facility]  2. [redacted] weeks gestation of pregnancy - Culture, beta strep (group b only) - GC/Chlamydia probe amp ([place])not at [facility]  3. BMI 30.0-30.9,adult  4. Anxiety   Preterm labor symptoms and general obstetric precautions including but not limited to vaginal bleeding, contractions, leaking of fluid and fetal movement were reviewed in detail with the patient. Please refer to  After Visit Summary for other counseling recommendations.   No follow-ups on file.  Future Appointments  Date Time Provider [place]  09/21/2022  8:15 AM Truett Mainland, DO [facility] None  09/28/2022  8:15 AM Truett Mainland, DO [facility] None  10/04/2022  8:15 AM Darliss Cheney, MD [facility] None    Truett Mainland, DO

## 2022-09-14 LAB — GC/CHLAMYDIA PROBE AMP (~~LOC~~) NOT AT ARMC
Chlamydia: NEGATIVE
Comment: NEGATIVE
Comment: NORMAL
Neisseria Gonorrhea: NEGATIVE

## 2022-09-14 LAB — SPECIMEN STATUS REPORT

## 2022-09-18 LAB — CULTURE, BETA STREP (GROUP B ONLY): Strep Gp B Culture: NEGATIVE

## 2022-09-18 LAB — SPECIMEN STATUS REPORT

## 2022-09-21 ENCOUNTER — Ambulatory Visit (INDEPENDENT_AMBULATORY_CARE_PROVIDER_SITE_OTHER): Payer: Medicaid Other | Admitting: Family Medicine

## 2022-09-21 ENCOUNTER — Encounter: Payer: Medicaid Other | Admitting: Family Medicine

## 2022-09-21 VITALS — BP 108/74 | HR 110 | Wt 189.0 lb

## 2022-09-21 DIAGNOSIS — Z3400 Encounter for supervision of normal first pregnancy, unspecified trimester: Secondary | ICD-10-CM

## 2022-09-21 DIAGNOSIS — Z3A37 37 weeks gestation of pregnancy: Secondary | ICD-10-CM

## 2022-09-21 DIAGNOSIS — Z3403 Encounter for supervision of normal first pregnancy, third trimester: Secondary | ICD-10-CM

## 2022-09-21 NOTE — Progress Notes (Signed)
   PRENATAL VISIT NOTE  Subjective:  Jill Schwartz is a 26 y.o. G1P0 at [redacted]w[redacted]d being seen today for ongoing prenatal care.  She is currently monitored for the following issues for this low-risk pregnancy and has Supervision of normal first pregnancy, antepartum; Anxiety; and BMI 30.0-30.9,adult on their problem list.  Patient reports no complaints.  Contractions: Not present. Vag. Bleeding: None.  Movement: Present. Denies leaking of fluid.   The following portions of the patient's history were reviewed and updated as appropriate: allergies, current medications, past family history, past medical history, past social history, past surgical history and problem list.   Objective:   Vitals:   09/21/22 0819  BP: 108/74  Pulse: (!) 110  Weight: 189 lb (85.7 kg)    Fetal Status: Fetal Heart Rate (bpm): 134   Movement: Present     General:  Alert, oriented and cooperative. Patient is in no acute distress.  Skin: Skin is warm and dry. No rash noted.   Cardiovascular: Normal heart rate noted  Respiratory: Normal respiratory effort, no problems with respiration noted  Abdomen: Soft, gravid, appropriate for gestational age.  Pain/Pressure: Present     Pelvic: Cervical exam deferred        Extremities: Normal range of motion.  Edema: None  Mental Status: Normal mood and affect. Normal behavior. Normal judgment and thought content.   Assessment and Plan:  Pregnancy: G1P0 at [redacted]w[redacted]d 1. [redacted] weeks gestation of pregnancy  2. Supervision of normal first pregnancy, antepartum FHT and FH normal   Term labor symptoms and general obstetric precautions including but not limited to vaginal bleeding, contractions, leaking of fluid and fetal movement were reviewed in detail with the patient. Please refer to After Visit Summary for other counseling recommendations.   No follow-ups on file.  Future Appointments  Date Time Provider Department Center  09/28/2022  8:15 AM Levie Heritage, DO [facility]  None  10/04/2022  8:15 AM Lorriane Shire, MD [facility] None    Levie Heritage, DO

## 2022-09-28 ENCOUNTER — Ambulatory Visit (INDEPENDENT_AMBULATORY_CARE_PROVIDER_SITE_OTHER): Payer: Medicaid Other | Admitting: Family Medicine

## 2022-09-28 ENCOUNTER — Encounter: Payer: Medicaid Other | Admitting: Family Medicine

## 2022-09-28 VITALS — BP 119/72 | HR 91 | Wt 190.0 lb

## 2022-09-28 DIAGNOSIS — Z3400 Encounter for supervision of normal first pregnancy, unspecified trimester: Secondary | ICD-10-CM

## 2022-09-28 DIAGNOSIS — Z6830 Body mass index (BMI) 30.0-30.9, adult: Secondary | ICD-10-CM

## 2022-09-28 DIAGNOSIS — Z3403 Encounter for supervision of normal first pregnancy, third trimester: Secondary | ICD-10-CM

## 2022-09-28 DIAGNOSIS — Z3A38 38 weeks gestation of pregnancy: Secondary | ICD-10-CM

## 2022-09-28 NOTE — Progress Notes (Signed)
   PRENATAL VISIT NOTE  Subjective:  Jill Schwartz is a 26 y.o. G1P0 at [redacted]w[redacted]d being seen today for ongoing prenatal care.  She is currently monitored for the following issues for this low-risk pregnancy and has Supervision of normal first pregnancy, antepartum; Anxiety; and BMI 30.0-30.9,adult on their problem list.  Patient reports no complaints.  Contractions: Not present. Vag. Bleeding: None.  Movement: Present. Denies leaking of fluid.   The following portions of the patient's history were reviewed and updated as appropriate: allergies, current medications, past family history, past medical history, past social history, past surgical history and problem list.   Objective:   Vitals:   09/28/22 0819  BP: 119/72  Pulse: 91  Weight: 190 lb (86.2 kg)    Fetal Status: Fetal Heart Rate (bpm): 132   Movement: Present     General:  Alert, oriented and cooperative. Patient is in no acute distress.  Skin: Skin is warm and dry. No rash noted.   Cardiovascular: Normal heart rate noted  Respiratory: Normal respiratory effort, no problems with respiration noted  Abdomen: Soft, gravid, appropriate for gestational age.  Pain/Pressure: Present     Pelvic: Cervical exam deferred        Extremities: Normal range of motion.  Edema: Trace  Mental Status: Normal mood and affect. Normal behavior. Normal judgment and thought content.   Assessment and Plan:  Pregnancy: G1P0 at [redacted]w[redacted]d 1. Supervision of normal first pregnancy, antepartum FHT and FH normal BPP at 40 weeks scheduled.  2. BMI 30.0-30.9,adult   Preterm labor symptoms and general obstetric precautions including but not limited to vaginal bleeding, contractions, leaking of fluid and fetal movement were reviewed in detail with the patient. Please refer to After Visit Summary for other counseling recommendations.   Return for needs BPP for week after thanksgiving.  Future Appointments  Date Time Provider Department Center   10/04/2022  8:15 AM Lorriane Shire, MD [facility] None    Levie Heritage, DO

## 2022-10-03 NOTE — Progress Notes (Unsigned)
   PRENATAL VISIT NOTE  Subjective:  Jill Schwartz is a 26 y.o. G1P0 at [redacted]w[redacted]d being seen today for ongoing prenatal care.  She is currently monitored for the following issues for this {Blank single:19197::"high-risk","low-risk"} pregnancy and has Supervision of normal first pregnancy, antepartum; Anxiety; and BMI 30.0-30.9,adult on their problem list.  Patient reports {sx:14538}.   .  .   . Denies leaking of fluid.   The following portions of the patient's history were reviewed and updated as appropriate: allergies, current medications, past family history, past medical history, past social history, past surgical history and problem list.   Objective:  There were no vitals filed for this visit.  Fetal Status:           General:  Alert, oriented and cooperative. Patient is in no acute distress.  Skin: Skin is warm and dry. No rash noted.   Cardiovascular: Normal heart rate noted  Respiratory: Normal respiratory effort, no problems with respiration noted  Abdomen: Soft, gravid, appropriate for gestational age.        Pelvic: {Blank single:19197::"Cervical exam performed in the presence of a chaperone","Cervical exam deferred"}        Extremities: Normal range of motion.     Mental Status: Normal mood and affect. Normal behavior. Normal judgment and thought content.   Assessment and Plan:  Pregnancy: G1P0 at [redacted]w[redacted]d There are no diagnoses linked to this encounter. {Blank single:19197::"Term","Preterm"} labor symptoms and general obstetric precautions including but not limited to vaginal bleeding, contractions, leaking of fluid and fetal movement were reviewed in detail with the patient. Please refer to After Visit Summary for other counseling recommendations.   No follow-ups on file.  Future Appointments  Date Time Provider Department Center  10/04/2022  8:15 AM Lorriane Shire, MD [facility] None  10/11/2022 11:15 AM [facility]    Lorriane Shire, MD

## 2022-10-04 ENCOUNTER — Telehealth (HOSPITAL_COMMUNITY): Payer: Self-pay | Admitting: *Deleted

## 2022-10-04 ENCOUNTER — Encounter (HOSPITAL_COMMUNITY): Payer: Self-pay | Admitting: *Deleted

## 2022-10-04 ENCOUNTER — Other Ambulatory Visit: Payer: Medicaid Other

## 2022-10-04 ENCOUNTER — Encounter: Payer: Self-pay | Admitting: Obstetrics and Gynecology

## 2022-10-04 ENCOUNTER — Ambulatory Visit (INDEPENDENT_AMBULATORY_CARE_PROVIDER_SITE_OTHER): Payer: Medicaid Other | Admitting: Obstetrics and Gynecology

## 2022-10-04 ENCOUNTER — Encounter: Payer: Medicaid Other | Admitting: Obstetrics and Gynecology

## 2022-10-04 VITALS — BP 108/71 | HR 97 | Wt 193.0 lb

## 2022-10-04 DIAGNOSIS — L299 Pruritus, unspecified: Secondary | ICD-10-CM

## 2022-10-04 DIAGNOSIS — Z3403 Encounter for supervision of normal first pregnancy, third trimester: Secondary | ICD-10-CM

## 2022-10-04 DIAGNOSIS — Z3400 Encounter for supervision of normal first pregnancy, unspecified trimester: Secondary | ICD-10-CM

## 2022-10-04 DIAGNOSIS — Z3A39 39 weeks gestation of pregnancy: Secondary | ICD-10-CM

## 2022-10-04 LAB — BILE ACIDS, TOTAL: Bile Acids Total: 2.3 umol/L (ref 0.0–10.0)

## 2022-10-04 NOTE — Telephone Encounter (Signed)
Preadmission screen  

## 2022-10-04 NOTE — Patient Instructions (Signed)
We are getting labs to check if you have cholestasis of pregnancy given the itching of your palms and feet

## 2022-10-06 ENCOUNTER — Other Ambulatory Visit: Payer: Self-pay | Admitting: Advanced Practice Midwife

## 2022-10-06 DIAGNOSIS — Z3490 Encounter for supervision of normal pregnancy, unspecified, unspecified trimester: Secondary | ICD-10-CM

## 2022-10-08 ENCOUNTER — Inpatient Hospital Stay (HOSPITAL_COMMUNITY)
Admission: AD | Admit: 2022-10-08 | Discharge: 2022-10-10 | DRG: 807 | Disposition: A | Discharge status: Home / Self Care | Payer: Medicaid Other | Attending: Obstetrics and Gynecology | Admitting: Obstetrics and Gynecology

## 2022-10-08 ENCOUNTER — Other Ambulatory Visit: Payer: Self-pay

## 2022-10-08 ENCOUNTER — Encounter (HOSPITAL_COMMUNITY): Payer: Self-pay | Admitting: Obstetrics and Gynecology

## 2022-10-08 ENCOUNTER — Inpatient Hospital Stay (HOSPITAL_COMMUNITY): Payer: Medicaid Other | Admitting: Anesthesiology

## 2022-10-08 ENCOUNTER — Inpatient Hospital Stay (HOSPITAL_COMMUNITY): Payer: Medicaid Other

## 2022-10-08 DIAGNOSIS — Z3490 Encounter for supervision of normal pregnancy, unspecified, unspecified trimester: Secondary | ICD-10-CM | POA: Diagnosis present

## 2022-10-08 DIAGNOSIS — Z3400 Encounter for supervision of normal first pregnancy, unspecified trimester: Secondary | ICD-10-CM

## 2022-10-08 DIAGNOSIS — F419 Anxiety disorder, unspecified: Secondary | ICD-10-CM | POA: Diagnosis present

## 2022-10-08 DIAGNOSIS — Z880 Allergy status to penicillin: Secondary | ICD-10-CM | POA: Diagnosis not present

## 2022-10-08 DIAGNOSIS — Z3A40 40 weeks gestation of pregnancy: Secondary | ICD-10-CM | POA: Diagnosis not present

## 2022-10-08 DIAGNOSIS — O26893 Other specified pregnancy related conditions, third trimester: Secondary | ICD-10-CM | POA: Diagnosis present

## 2022-10-08 LAB — CBC
HCT: 34.1 % — ABNORMAL LOW (ref 36.0–46.0)
Hemoglobin: 11.8 g/dL — ABNORMAL LOW (ref 12.0–15.0)
MCH: 30.2 pg (ref 26.0–34.0)
MCHC: 34.6 g/dL (ref 30.0–36.0)
MCV: 87.2 fL (ref 80.0–100.0)
Platelets: 332 10*3/uL (ref 150–400)
RBC: 3.91 MIL/uL (ref 3.87–5.11)
RDW: 13.9 % (ref 11.5–15.5)
WBC: 5.7 10*3/uL (ref 4.0–10.5)
nRBC: 0 % (ref 0.0–0.2)

## 2022-10-08 LAB — RPR: RPR Ser Ql: NONREACTIVE

## 2022-10-08 LAB — TYPE AND SCREEN
ABO/RH(D): A POS
Antibody Screen: NEGATIVE

## 2022-10-08 MED ORDER — LACTATED RINGERS IV SOLN
500.0000 mL | INTRAVENOUS | Status: DC | PRN
  Administered 2022-10-08 – 2022-10-09 (×2): 500 mL via INTRAVENOUS

## 2022-10-08 MED ORDER — FENTANYL CITRATE (PF) 100 MCG/2ML IJ SOLN
100.0000 ug | INTRAMUSCULAR | Status: DC | PRN
  Administered 2022-10-08 (×2): 100 ug via INTRAVENOUS
  Filled 2022-10-08 (×2): qty 2

## 2022-10-08 MED ORDER — FENTANYL-BUPIVACAINE-NACL 0.5-0.125-0.9 MG/250ML-% EP SOLN
12.0000 mL/h | EPIDURAL | Status: DC | PRN
  Administered 2022-10-08: 12 mL/h via EPIDURAL
  Filled 2022-10-08: qty 250

## 2022-10-08 MED ORDER — MISOPROSTOL 25 MCG QUARTER TABLET
25.0000 ug | ORAL_TABLET | Freq: Once | ORAL | Status: AC
  Administered 2022-10-08: 25 ug via VAGINAL
  Filled 2022-10-08: qty 1

## 2022-10-08 MED ORDER — LACTATED RINGERS IV SOLN: 500.0000 mL | Freq: Once | INTRAVENOUS | Status: DC

## 2022-10-08 MED ORDER — LIDOCAINE HCL (PF) 1 % IJ SOLN: 30.0000 mL | INTRAMUSCULAR | Status: DC | PRN

## 2022-10-08 MED ORDER — MISOPROSTOL 50MCG HALF TABLET
50.0000 ug | ORAL_TABLET | Freq: Once | ORAL | Status: AC
  Administered 2022-10-08: 50 ug via ORAL
  Filled 2022-10-08: qty 1

## 2022-10-08 MED ORDER — OXYTOCIN-SODIUM CHLORIDE 30-0.9 UT/500ML-% IV SOLN
2.5000 [IU]/h | INTRAVENOUS | Status: DC
  Filled 2022-10-08: qty 500

## 2022-10-08 MED ORDER — LACTATED RINGERS IV SOLN: INTRAVENOUS | Status: DC

## 2022-10-08 MED ORDER — MISOPROSTOL 50MCG HALF TABLET: 50.0000 ug | ORAL_TABLET | ORAL | Status: DC | PRN

## 2022-10-08 MED ORDER — DIPHENHYDRAMINE HCL 50 MG/ML IJ SOLN: 12.5000 mg | INTRAMUSCULAR | Status: DC | PRN

## 2022-10-08 MED ORDER — EPHEDRINE 5 MG/ML INJ: 10.0000 mg | INTRAVENOUS | Status: DC | PRN

## 2022-10-08 MED ORDER — ACETAMINOPHEN 325 MG PO TABS: 650.0000 mg | ORAL_TABLET | ORAL | Status: DC | PRN

## 2022-10-08 MED ORDER — OXYTOCIN-SODIUM CHLORIDE 30-0.9 UT/500ML-% IV SOLN
1.0000 m[IU]/min | INTRAVENOUS | Status: DC
  Administered 2022-10-08: 2 m[IU]/min via INTRAVENOUS

## 2022-10-08 MED ORDER — OXYCODONE-ACETAMINOPHEN 5-325 MG PO TABS: 1.0000 | ORAL_TABLET | ORAL | Status: DC | PRN

## 2022-10-08 MED ORDER — SOD CITRATE-CITRIC ACID 500-334 MG/5ML PO SOLN: 30.0000 mL | ORAL | Status: DC | PRN

## 2022-10-08 MED ORDER — LIDOCAINE HCL (PF) 1 % IJ SOLN
INTRAMUSCULAR | Status: DC | PRN
  Administered 2022-10-08 (×2): 5 mL via EPIDURAL

## 2022-10-08 MED ORDER — PHENYLEPHRINE 80 MCG/ML (10ML) SYRINGE FOR IV PUSH (FOR BLOOD PRESSURE SUPPORT): 80.0000 ug | PREFILLED_SYRINGE | INTRAVENOUS | Status: DC | PRN

## 2022-10-08 MED ORDER — TERBUTALINE SULFATE 1 MG/ML IJ SOLN: 0.2500 mg | Freq: Once | INTRAMUSCULAR | Status: DC | PRN

## 2022-10-08 MED ORDER — OXYTOCIN BOLUS FROM INFUSION
333.0000 mL | Freq: Once | INTRAVENOUS | Status: AC
  Administered 2022-10-09: 333 mL via INTRAVENOUS

## 2022-10-08 MED ORDER — OXYCODONE-ACETAMINOPHEN 5-325 MG PO TABS: 2.0000 | ORAL_TABLET | ORAL | Status: DC | PRN

## 2022-10-08 MED ORDER — ONDANSETRON HCL 4 MG/2ML IJ SOLN
4.0000 mg | Freq: Four times a day (QID) | INTRAMUSCULAR | Status: DC | PRN
  Administered 2022-10-08: 4 mg via INTRAVENOUS
  Filled 2022-10-08: qty 2

## 2022-10-08 NOTE — Progress Notes (Signed)
Labor Progress Note Jill Schwartz is a 26 y.o. G1P0 at [redacted]w[redacted]d presented for eIOL  S:  Feeling painful ctx, had recent dose of Fentanyl that helped.   O:  BP 117/83   Pulse 92   Temp 98.3 F (36.8 C) (Oral)   Resp 18   Ht 5\' 5"  (1.651 m)   Wt 88.1 kg   LMP 01/01/2022   BMI 32.33 kg/m  EFM: baseline 120 bpm/ mod variability/ + accels/ no decels  Toco/IUPC: 2-5 SVE: Dilation: 5.5 Effacement (%): 90 Cervical Position: Posterior Station: -2 Presentation: Vertex Exam by:: [email or web address] CNM  A/P: 26 y.o. G1P0 [redacted]w[redacted]d  1. Labor: early active, good progress 2. FWB: Cat I 3. Pain: analgesia/anesthesia/NO prn   Expectant mngt. Consider AROM at next check if unchanged. Anticipate labor progress and SVD.  [redacted]w[redacted]d, CNM 4:03 PM

## 2022-10-08 NOTE — Progress Notes (Signed)
Labor Progress Note Jill Schwartz is a 26 y.o. G1P0 at [redacted]w[redacted]d presented for eIOL  S:  Feeling more regular ctx, some are painful.   O:  BP 130/82 (BP Location: Left Arm)   Pulse 84   Temp 99.1 F (37.3 C) (Oral)   Resp 16   Ht 5\' 5"  (1.651 m)   Wt 88.1 kg   LMP 01/01/2022   BMI 32.33 kg/m  EFM: baseline 140 bpm/ mod variability/ + accels/ no decels  Toco/IUPC: 2-3 SVE: Dilation: 1.5 Effacement (%): 80 Cervical Position: Posterior Station: -2 Presentation: Vertex Exam by:: [email or web address], CNM  A/P: 26 y.o. G1P0 [redacted]w[redacted]d  1. Labor: latent 2. FWB: Cat I 3. Pain: analgesia/anesthesia/NO prn  Consented for FB, place w/o difficulty. Anticipate labor progress and SVD.  [redacted]w[redacted]d, CNM 12:05 PM

## 2022-10-08 NOTE — Anesthesia Procedure Notes (Signed)
Epidural Patient location during procedure: OB Start time: 10/08/2022 4:34 PM End time: 10/08/2022 4:43 PM  Staffing Anesthesiologist: Mal Amabile, MD Performed: anesthesiologist   Preanesthetic Checklist Completed: patient identified, IV checked, site marked, risks and benefits discussed, surgical consent, monitors and equipment checked, pre-op evaluation and timeout performed  Epidural Patient position: sitting Prep: DuraPrep and site prepped and draped Patient monitoring: continuous pulse ox and blood pressure Approach: midline Location: L3-L4 Injection technique: LOR air  Needle:  Needle type: Tuohy  Needle gauge: 17 G Needle length: 9 cm and 9 Needle insertion depth: 5 cm Catheter type: closed end flexible Catheter size: 19 Gauge Catheter at skin depth: 10 cm Test dose: negative and Other  Assessment Events: blood not aspirated, injection not painful, no injection resistance, no paresthesia and negative IV test  Additional Notes Patient identified. Risks and benefits discussed including failed block, incomplete  Pain control, post dural puncture headache, nerve damage, paralysis, blood pressure Changes, nausea, vomiting, reactions to medications-both toxic and allergic and post Partum back pain. All questions were answered. Patient expressed understanding and wished to proceed. Sterile technique was used throughout procedure. Epidural site was Dressed with sterile barrier dressing. No paresthesias, signs of intravascular injection Or signs of intrathecal spread were encountered.  Patient was more comfortable after the epidural was dosed. Please see RN's note for documentation of vital signs and FHR which are stable. Reason for block:procedure for pain

## 2022-10-08 NOTE — Anesthesia Preprocedure Evaluation (Addendum)
Anesthesia Evaluation  Patient identified by MRN, date of birth, ID band Patient awake    Reviewed: Allergy & Precautions, Patient's Chart, lab work & pertinent test results  History of Anesthesia Complications (+) MALIGNANT HYPERTHERMIA, Family history of anesthesia reaction and history of anesthetic complications  Airway Mallampati: II       Dental no notable dental hx. (+) Dental Advisory Given, Teeth Intact   Pulmonary neg pulmonary ROS   Pulmonary exam normal breath sounds clear to auscultation       Cardiovascular negative cardio ROS Normal cardiovascular exam Rhythm:Regular Rate:Normal     Neuro/Psych  Headaches PSYCHIATRIC DISORDERS Anxiety Depression    ADD   GI/Hepatic ,GERD  ,,  Endo/Other  Obesity  Renal/GU negative Renal ROS  negative genitourinary   Musculoskeletal negative musculoskeletal ROS (+)    Abdominal  (+) + obese  Peds  Hematology  (+) Blood dyscrasia, anemia   Anesthesia Other Findings   Reproductive/Obstetrics (+) Pregnancy                             Anesthesia Physical Anesthesia Plan  ASA: 2  Anesthesia Plan: Epidural   Post-op Pain Management: Minimal or no pain anticipated and Epidural*   Induction: Intravenous  PONV Risk Score and Plan: Treatment may vary due to age or medical condition  Airway Management Planned: Natural Airway  Additional Equipment:   Intra-op Plan:   Post-operative Plan:   Informed Consent: I have reviewed the patients History and Physical, chart, labs and discussed the procedure including the risks, benefits and alternatives for the proposed anesthesia with the patient or authorized representative who has indicated his/her understanding and acceptance.       Plan Discussed with: Anesthesiologist  Anesthesia Plan Comments:         Anesthesia Quick Evaluation

## 2022-10-08 NOTE — H&P (Signed)
OBSTETRIC ADMISSION HISTORY AND PHYSICAL  Jill Schwartz is a 26 y.o. female G1P0 with IUP at [redacted]w[redacted]d presenting for elective IOL. She reports +FMs. No LOF, VB, blurry vision, headaches, peripheral edema, or RUQ pain. She plans on breastfeeding. She requests POPs for birth control.  Dating: By LMP --->  Estimated Date of Delivery: 10/08/22  Sono:    @[redacted]w[redacted]d , normal anatomy, 458g, 19%ile  Prenatal History/Complications: -none  Past Medical History: Past Medical History:  Diagnosis Date   Anxiety    Attention deficit disorder    Depression    Family history of adverse reaction to anesthesia    Great aunt died from Malignant hyperthermia and great uncle almost died from it   Headache    Macromastia    Bilateral   Malignant hyperthermia    Great aunt died and great uncle almost died   Shoulder dislocation     Past Surgical History: Past Surgical History:  Procedure Laterality Date   BREAST REDUCTION SURGERY Bilateral 03/01/2018   Procedure: BILATERAL MAMMARY REDUCTION  (BREAST);  Surgeon: 03/03/2018, MD;  Location: [facility] OR;  Service: Plastics;  Laterality: Bilateral;   BUTTOCK LIFT  08/2019   SHOULDER SURGERY Right     Obstetrical History: OB History     Gravida  1   Para      Term      Preterm      AB      Living         SAB      IAB      Ectopic      Multiple      Live Births              Social History: Social History   Socioeconomic History   Marital status: Single    Spouse name: Not on file   Number of children: Not on file   Years of education: Not on file   Highest education level: High school graduate  Occupational History   Not on file  Tobacco Use   Smoking status: Never   Smokeless tobacco: Never  Vaping Use   Vaping Use: Never used  Substance and Sexual Activity   Alcohol use: No   Drug use: No   Sexual activity: Yes    Birth control/protection: None  Other Topics Concern   Not on file  Social History Narrative    Pt lives in [place] with significant other.  She is not followed by outpatient psychiatrist.   Social Determinants of Health   Financial Resource Strain: Not on file  Food Insecurity: No Food Insecurity (10/08/2022)   Hunger Vital Sign    Worried About Running Out of Food in the Last Year: Never true    Ran Out of Food in the Last Year: Never true  Transportation Needs: No Transportation Needs (10/08/2022)   PRAPARE - 10/10/2022 (Medical): No    Lack of Transportation (Non-Medical): No  Physical Activity: Not on file  Stress: Not on file  Social Connections: Not on file    Family History: Family History  Problem Relation Age of Onset   Diabetes Maternal Aunt    Hypertension Maternal Aunt    Hypertension Maternal Grandmother    Stroke Maternal Grandfather    Hypertension Maternal Grandfather    Cancer Other    Malignant hyperthermia Other    Malignant hyperthermia Other    Obesity Neg Hx     Allergies: Allergies  Allergen  Reactions   Penicillins Hives and Other (See Comments)    Has patient had a PCN reaction causing immediate rash, facial/tongue/throat swelling, SOB or lightheadedness with hypotension: Yes Has patient had a PCN reaction causing severe rash involving mucus membranes or skin necrosis: No Has patient had a PCN reaction that required hospitalization: Yes Has patient had a PCN reaction occurring within the last 10 years: No If all of the above answers are "NO", then may proceed with Cephalosporin use.    Metformin And Related Other (See Comments)    Severe HAs    Medications Prior to Admission  Medication Sig Dispense Refill Last Dose   diphenhydrAMINE (BENADRYL) 25 MG tablet Take 25 mg by mouth every 6 (six) hours as needed.   10/07/2022   Prenatal Vit-Fe Fumarate-FA (PRENATAL VITAMINS) 28-0.8 MG TABS Take 1 tablet by mouth daily. 30 tablet 11 Past Week   lidocaine (XYLOCAINE) 2 % jelly Apply 1 Application topically as  needed. (Patient not taking: Reported on 07/25/2022) 10 mL 0 Unknown   terconazole (TERAZOL 7) 0.4 % vaginal cream Insert 1 applicator intravaginally every night for 3 days. (Patient not taking: Reported on 07/25/2022) 45 g 0 Unknown   Review of Systems:  All systems reviewed and negative except as stated in HPI  PE: Blood pressure 122/86, pulse 88, temperature 98.3 F (36.8 C), temperature source Oral, resp. rate 18, height 5\' 5"  (1.651 m), weight 88.1 kg, last menstrual period 01/01/2022. General appearance: alert, cooperative, and no distress Lungs: regular rate and effort Heart: regular rate  Abdomen: soft, non-tender Extremities: Homans sign is negative, no sign of DVT Presentation: cephalic per RN BSUS EFM: 135 bpm, mod variability, + accels, no decels Toco: q2-4 Dilation: Closed Effacement (%): 50 Station: Ballotable Exam by:: C [email or web address] RN  Prenatal labs: ABO, Rh: --/--/A POS (11/26 0745) Antibody: NEG (11/26 0745) Rubella: 4.56 (05/03 0947) RPR: Non Reactive (09/07 0854)  HBsAg: Negative (05/03 0947)  HIV: Non Reactive (09/07 0854)  GBS: Negative/-- (11/01 0000)  2 hr GTT nml  Prenatal Transfer Tool  Maternal Diabetes: No Genetic Screening: Normal Maternal Ultrasounds/Referrals: Normal Fetal Ultrasounds or other Referrals:  None Maternal Substance Abuse:  No Significant Maternal Medications:  None Significant Maternal Lab Results: Group B Strep negative  Results for orders placed or performed during the hospital encounter of 10/08/22 (from the past 24 hour(s))  CBC   Collection Time: 10/08/22  7:44 AM  Result Value Ref Range   WBC 5.7 4.0 - 10.5 K/uL   RBC 3.91 3.87 - 5.11 MIL/uL   Hemoglobin 11.8 (L) 12.0 - 15.0 g/dL   HCT 10/10/22 (L) 67.3 - 41.9 %   MCV 87.2 80.0 - 100.0 fL   MCH 30.2 26.0 - 34.0 pg   MCHC 34.6 30.0 - 36.0 g/dL   RDW 37.9 02.4 - 09.7 %   Platelets 332 150 - 400 K/uL   nRBC 0.0 0.0 - 0.2 %  Type and screen   Collection Time: 10/08/22  7:45  AM  Result Value Ref Range   ABO/RH(D) A POS    Antibody Screen NEG    Sample Expiration      10/11/2022,2359 Performed at [facility], [address] [place], [place]     Patient Active Problem List   Diagnosis Date Noted   Encounter for elective induction of labor 10/08/2022   Supervision of normal first pregnancy, antepartum 03/15/2022   Anxiety 03/15/2022   BMI 30.0-30.9,adult 03/15/2022   Assessment: Jill  MARQUE Schwartz is a 26 y.o. G1P0 at [redacted]w[redacted]d here for eIOL  1. Labor: latent 2. FWB: Cat I 3. Pain: analgesia/anesthesia 4. GBS: neg   Plan: Admit to LD Cervical ripening with Cytotec Anticipate SVD  Donette Larry, CNM  10/08/2022, 8:41 AM

## 2022-10-08 NOTE — Progress Notes (Addendum)
Labor Progress Note Teela HOMER PFEIFER is a 26 y.o. G1P0 at [redacted]w[redacted]d presented for eIOL  S:  Comfortable with epidural. No complaints at this time.  O:  BP 138/75   Pulse (!) 59   Temp 98 F (36.7 C) (Oral)   Resp 18   Ht 5\' 5"  (1.651 m)   Wt 88.1 kg   LMP 01/01/2022   SpO2 100%   BMI 32.33 kg/m  EFM: baseline 135 bpm/ mod variability/ + accels/ no decels  Toco/IUPC: contractions q 2-3 min SVE: Dilation: 7 Effacement (%): 90 Cervical Position: Posterior Station: Plus 1 Presentation: Vertex Exam by:: Dr. [email or web address]  A/P: 26 y.o. G1P0 [redacted]w[redacted]d  1. Labor: on pitocin 2. FWB: Cat I 3. Pain: epidural  Aarian Cleaver N. [redacted]w[redacted]d, MD 12:03 AM

## 2022-10-08 NOTE — Progress Notes (Signed)
Labor Progress Note Jill Schwartz is a 26 y.o. G1P0 at [redacted]w[redacted]d presented for eIOL  S:  Comfortable with epidural. No complaints.   O:  BP 116/82   Pulse 81   Temp 98.3 F (36.8 C) (Oral)   Resp 16   Ht 5\' 5"  (1.651 m)   Wt 88.1 kg   LMP 01/01/2022   SpO2 100%   BMI 32.33 kg/m  EFM: baseline 125 bpm/ mod variability/ + accels/ no decels  Toco/IUPC: 2-7 SVE: Dilation: 5.5 Effacement (%): 90 Cervical Position: Posterior Station: -2 Presentation: Vertex Exam by:: [email or web address] CNM  A/P: 26 y.o. G1P0 [redacted]w[redacted]d  1. Labor: protracted 2. FWB: Cat I 3. Pain: epidural  Recommend AROM for augmentation, pt consents. AROM for moderate amt clear fluid. Anticipate SVD.  [redacted]w[redacted]d, CNM 5:57 PM

## 2022-10-08 NOTE — Progress Notes (Signed)
Labor Progress Note Santanna WEALTHY DANIELSKI is a 26 y.o. G1P0 at [redacted]w[redacted]d presented for eIOL  S:  Comfortable with epidural. No complaints. Open to starting pitocin.   O:  BP 122/81   Pulse 85   Temp 97.9 F (36.6 C) (Oral)   Resp 16   Ht 5\' 5"  (1.651 m)   Wt 88.1 kg   LMP 01/01/2022   SpO2 100%   BMI 32.33 kg/m  EFM: baseline 120 bpm/ mod variability/ + accels/ no decels  Toco/IUPC: contractions q 3 min SVE: Dilation: 6 Effacement (%): 90 Cervical Position: Posterior Station: 0 Presentation: Vertex Exam by:: Dr. [email or web address]  A/P: 26 y.o. G1P0 [redacted]w[redacted]d  1. Labor: in active labor, will start pitocin 2. FWB: Cat I 3. Pain: epidural  Akaisha Truman N. [redacted]w[redacted]d, MD 9:55 PM

## 2022-10-09 ENCOUNTER — Encounter (HOSPITAL_COMMUNITY): Payer: Self-pay | Admitting: Obstetrics and Gynecology

## 2022-10-09 DIAGNOSIS — Z3A40 40 weeks gestation of pregnancy: Secondary | ICD-10-CM

## 2022-10-09 LAB — CBC
HCT: 27.3 % — ABNORMAL LOW (ref 36.0–46.0)
Hemoglobin: 9.5 g/dL — ABNORMAL LOW (ref 12.0–15.0)
MCH: 30.9 pg (ref 26.0–34.0)
MCHC: 34.8 g/dL (ref 30.0–36.0)
MCV: 88.9 fL (ref 80.0–100.0)
Platelets: 269 10*3/uL (ref 150–400)
RBC: 3.07 MIL/uL — ABNORMAL LOW (ref 3.87–5.11)
RDW: 14 % (ref 11.5–15.5)
WBC: 11.3 10*3/uL — ABNORMAL HIGH (ref 4.0–10.5)
nRBC: 0 % (ref 0.0–0.2)

## 2022-10-09 MED ORDER — COCONUT OIL OIL: 1.0000 | TOPICAL_OIL | Status: DC | PRN

## 2022-10-09 MED ORDER — BENZOCAINE-MENTHOL 20-0.5 % EX AERO
1.0000 | INHALATION_SPRAY | CUTANEOUS | Status: DC | PRN
  Administered 2022-10-09: 1 via TOPICAL
  Filled 2022-10-09: qty 56

## 2022-10-09 MED ORDER — ZOLPIDEM TARTRATE 5 MG PO TABS: 5.0000 mg | ORAL_TABLET | Freq: Every evening | ORAL | Status: DC | PRN

## 2022-10-09 MED ORDER — ACETAMINOPHEN 325 MG PO TABS: 650.0000 mg | ORAL_TABLET | ORAL | Status: DC | PRN

## 2022-10-09 MED ORDER — SENNOSIDES-DOCUSATE SODIUM 8.6-50 MG PO TABS
2.0000 | ORAL_TABLET | Freq: Every day | ORAL | Status: DC
  Administered 2022-10-10: 2 via ORAL
  Filled 2022-10-09: qty 2

## 2022-10-09 MED ORDER — IBUPROFEN 600 MG PO TABS
600.0000 mg | ORAL_TABLET | Freq: Four times a day (QID) | ORAL | Status: DC
  Administered 2022-10-09 – 2022-10-10 (×6): 600 mg via ORAL
  Filled 2022-10-09 (×6): qty 1

## 2022-10-09 MED ORDER — SIMETHICONE 80 MG PO CHEW: 80.0000 mg | CHEWABLE_TABLET | ORAL | Status: DC | PRN

## 2022-10-09 MED ORDER — PRENATAL MULTIVITAMIN CH
1.0000 | ORAL_TABLET | Freq: Every day | ORAL | Status: DC
  Administered 2022-10-09 – 2022-10-10 (×2): 1 via ORAL
  Filled 2022-10-09 (×2): qty 1

## 2022-10-09 MED ORDER — DIBUCAINE (PERIANAL) 1 % EX OINT: 1.0000 | TOPICAL_OINTMENT | CUTANEOUS | Status: DC | PRN

## 2022-10-09 MED ORDER — WITCH HAZEL-GLYCERIN EX PADS: 1.0000 | MEDICATED_PAD | CUTANEOUS | Status: DC | PRN

## 2022-10-09 MED ORDER — ONDANSETRON HCL 4 MG PO TABS: 4.0000 mg | ORAL_TABLET | ORAL | Status: DC | PRN

## 2022-10-09 MED ORDER — ONDANSETRON HCL 4 MG/2ML IJ SOLN: 4.0000 mg | INTRAMUSCULAR | Status: DC | PRN

## 2022-10-09 MED ORDER — DIPHENHYDRAMINE HCL 25 MG PO CAPS: 25.0000 mg | ORAL_CAPSULE | Freq: Four times a day (QID) | ORAL | Status: DC | PRN

## 2022-10-09 MED ORDER — TETANUS-DIPHTH-ACELL PERTUSSIS 5-2.5-18.5 LF-MCG/0.5 IM SUSY: 0.5000 mL | PREFILLED_SYRINGE | Freq: Once | INTRAMUSCULAR | Status: DC

## 2022-10-09 NOTE — Discharge Summary (Shared)
Postpartum Discharge Summary  Date of Service updated***     Patient Name: Jill Schwartz DOB: 09-07-1996 MRN: [id number]  Date of admission: 10/08/2022 Delivery date:10/09/2022  Delivering provider: Shelda Pal  Date of discharge: 10/09/2022  Admitting diagnosis: Encounter for elective induction of labor [Z34.90] Intrauterine pregnancy: [redacted]w[redacted]d    Secondary diagnosis:  Principal Problem:   Vaginal delivery Active Problems:   Supervision of normal first pregnancy, antepartum   Anxiety   Encounter for elective induction of labor   Second degree perineal laceration  Additional problems: ***    Discharge diagnosis: Term Pregnancy Delivered                                              Post partum procedures:{Postpartum procedures:23558} Augmentation: AROM, Pitocin, and Cytotec Complications: None  Hospital course: Induction of Labor With Vaginal Delivery   26y.o. yo G1P1001 at 462w1das admitted to the hospital 10/08/2022 for induction of labor.  Indication for induction: Elective.  Patient had an labor course complicated by no Membrane Rupture Time/Date: 5:54 PM ,10/08/2022   Delivery Method:Vaginal, Spontaneous  Episiotomy: None  Lacerations:  2nd degree;Periurethral;Sulcus;Perineal  Details of delivery can be found in separate delivery note.  Patient had a postpartum course complicated by***. Patient is discharged home 10/09/22.  Newborn Data: Birth date:10/09/2022  Birth time:2:20 AM  Gender:Female  Living status:Living  Apgars:9 ,9  Weight:3270 g   Magnesium Sulfate received: {Mag received:30440022} BMZ received: No Rhophylac:N/A MMR:N/A T-DaP:Given prenatally Flu: No Transfusion:{Transfusion received:30440034}  Physical exam  Vitals:   10/09/22 0330 10/09/22 0345 10/09/22 0400 10/09/22 0419  BP: (!) 111/59 (!) 130/90 118/82 119/75  Pulse: (!) 103 (!) 124 (!) 124 95  Resp: _0 Temp:    98.2 F (36.8 C)  TempSrc:    Oral  SpO2:       Weight:      Height:       General: {Exam; general:21111117} Lochia: {Desc; appropriate/inappropriate:30686::"appropriate"} Uterine Fundus: {Desc; firm/soft:30687} Incision: {Exam; incision:21111123} DVT Evaluation: {Exam; dvt:2111122} Labs: Lab Results  Component Value Date   WBC 5.7 10/08/2022   HGB 11.8 (L) 10/08/2022   HCT 34.1 (L) 10/08/2022   MCV 87.2 10/08/2022   PLT 332 10/08/2022      Latest Ref Rng & Units 02/06/2022    4:28 PM  CMP  Glucose 70 - 99 mg/dL 107   BUN 6 - 20 mg/dL 10   Creatinine 0.44 - 1.00 mg/dL 0.94   Sodium 135 - 145 mmol/L 138   Potassium 3.5 - 5.1 mmol/L 3.7   Chloride 98 - 111 mmol/L 109   CO2 22 - 32 mmol/L 24   Calcium 8.9 - 10.3 mg/dL 9.4   Total Protein 6.5 - 8.1 g/dL 7.3   Total Bilirubin 0.3 - 1.2 mg/dL 0.2   Alkaline Phos 38 - 126 U/L 46   AST 15 - 41 U/L 18   ALT 0 - 44 U/L 18    Edinburgh Score:     No data to display           After visit meds:  Allergies as of 10/09/2022       Reactions   Penicillins Hives, Other (See Comments)   Has patient had a PCN reaction causing immediate rash, facial/tongue/throat swelling, SOB or lightheadedness with hypotension: Yes Has patient  had a PCN reaction causing severe rash involving mucus membranes or skin necrosis: No Has patient had a PCN reaction that required hospitalization: Yes Has patient had a PCN reaction occurring within the last 10 years: No If all of the above answers are "NO", then may proceed with Cephalosporin use.   Metformin And Related Other (See Comments)   Severe HAs     Med Rec must be completed prior to using this [facility]***        Discharge home in stable condition Infant Feeding: {Baby feeding:23562} Infant Disposition:{CHL IP OB HOME WITH NVVYXA:15872} Discharge instruction: per After Visit Summary and Postpartum booklet. Activity: Advance as tolerated. Pelvic rest for 6 weeks.  Diet: {OB BMBO:48592763} Future Appointments: Future  Appointments  Date Time Provider [place]  11/08/2022 11:15 AM Truett Mainland, DO [facility] None   Follow up Visit: Message sent to [facility] on 11.27  Please schedule this patient for a In person postpartum visit in 6 weeks with the following provider: Any provider. Additional Postpartum F/U:Postpartum Depression checkup  Low risk pregnancy complicated by:  none Delivery mode:  Vaginal, Spontaneous  Anticipated Birth Control:  POPs   10/09/2022 Shelda Pal, DO

## 2022-10-09 NOTE — Lactation Note (Signed)
This note was copied from a baby's chart. Lactation Consultation Note  Patient Name: Jill Schwartz HERDE'Y Date: 10/09/2022 Reason for consult: Initial assessment;Term;Primapara;1st time breastfeeding Age:25 hours   P1: Term infant at 40+1 weeks Feeding preference: Breast  "[name]" was swaddled and asleep in the bassinet when I arrived.  Mother fed approximately 4 hours ago.  Suggested we awaken "[name]" and attempt to breast feed.  Education completed on the importance of feeding STS.  Taught hand expression; no drops noted at this time.  Assisted to latch, however, "[name]" was not at all interested in initiating a suck.  Demonstrated breast compressions and gentle stimulation.  Placed his STS on mother's chest.  Suggested she call her RN/LC for latch assistance as needed.  Breast feeding basics reviewed.  Parents receptive to teaching.  Reassurance given. Provided breast shells and a manual pump with instructions for use.  Mother will pre-pump prior to latching to aid in nipple eversion.     Maternal Data Has patient been taught Hand Expression?: Yes Does the patient have breastfeeding experience prior to this delivery?: No  Feeding Mother's Current Feeding Choice: Breast Milk  LATCH Score Latch: Too sleepy or reluctant, no latch achieved, no sucking elicited.  Audible Swallowing: None  Type of Nipple: Flat  Comfort (Breast/Nipple): Soft / non-tender  Hold (Positioning): Assistance needed to correctly position infant at breast and maintain latch.  LATCH Score: 4   Lactation Tools Discussed/Used Tools: Shells;Pump;Flanges Flange Size: 24 Breast pump type: Manual Pump Education: Setup, frequency, and cleaning Reason for Pumping: Nipple eversion Pumping frequency: Prior to latching  Interventions Interventions: Breast feeding basics reviewed;Assisted with latch;Skin to skin;Breast massage;Hand express;Pre-pump if needed;Breast compression;Hand pump;Shells;Position  options;Support pillows;Adjust position;Education;LC Services brochure  Discharge Pump: Hands Free  Consult Status Consult Status: Follow-up Date: 10/10/22 Follow-up type: In-patient    Dora Sims 10/09/2022, 8:31 AM

## 2022-10-09 NOTE — Progress Notes (Signed)
Pt admitted to Mother/baby floor. Reviewed fall prevention precautions including not getting out of bed without staff assistance at this time due to post-epidural weakness/numbness in legs. Pt expressed understanding and that she will definitely call because she "fell downstairs." RN clarified pt's statement, pt said that she thought her legs were back to normal and she thought she could stand and her knees buckled under her and that she fell to her knees. Denies hitting head, back, or any other part of body. No bruising noted, pt denies pain or soreness in knees or legs. Reiterated fall precautions with pt. Called L&D RN to clarify pt's report; L&D RN states that she caught pt as her knees buckled, uncertainty as to whether knees could have hit wheelchair or floor.

## 2022-10-09 NOTE — Progress Notes (Signed)
   10/09/22 0430  What Happened  Who witnessed fall? Roma Schanz RN  Patients activity before fall to/from bed, chair, or stretcher  Point of contact other (comment) (knee)  Was patient injured? No  Follow Up  Family notified Yes - comment (family in room)  Time family notified 0430  Additional tests No  Progress note created (see row info) Yes  Adult Fall Risk Assessment  Risk Factor Category (scoring not indicated) Fall has occurred during this admission (document High fall risk)  Patient Fall Risk Level High fall risk  Adult Fall Risk Interventions  Required Bundle Interventions *See Row Information* High fall risk - low, moderate, and high requirements implemented  Screening for Fall Injury Risk (To be completed on HIGH fall risk patients) - Assessing Need for Floor Mats  Risk For Fall Injury- Criteria for Floor Mats Previous fall this admission  Pain Assessment  Pain Scale 0-10  Pain Score 0  Neurological  Neuro (WDL) WDL  Level of Consciousness Alert  Musculoskeletal  Musculoskeletal (WDL) WDL  Integumentary  Integumentary (WDL) WDL

## 2022-10-09 NOTE — Anesthesia Postprocedure Evaluation (Signed)
Anesthesia Post Note  Patient: [name]  Procedure(s) Performed: AN AD HOC LABOR EPIDURAL     Patient location during evaluation: Mother Baby Anesthesia Type: Epidural Level of consciousness: awake and alert and oriented Pain management: satisfactory to patient Vital Signs Assessment: post-procedure vital signs reviewed and stable Respiratory status: respiratory function stable Cardiovascular status: stable Postop Assessment: no headache, no backache, epidural receding, patient able to bend at knees, no signs of nausea or vomiting, adequate PO intake and able to ambulate Anesthetic complications: no   No notable events documented.  Last Vitals:  Vitals:   10/09/22 0510 10/09/22 0610  BP: (!) 123/90 121/81  Pulse: (!) 114 95  Resp: 18 18  Temp: 36.9 C 36.6 C  SpO2: 100%     Last Pain:  Vitals:   10/09/22 0610  TempSrc: Oral  PainSc: 0-No pain   Pain Goal:                   Nishi Neiswonger

## 2022-10-10 ENCOUNTER — Other Ambulatory Visit (HOSPITAL_COMMUNITY): Payer: Self-pay

## 2022-10-10 MED ORDER — NORETHINDRONE 0.35 MG PO TABS
ORAL_TABLET | Freq: Every day | ORAL | 11 refills | Status: DC
  Filled 2022-10-10: qty 28, 28d supply, fill #0

## 2022-10-10 MED ORDER — SENNOSIDES-DOCUSATE SODIUM 8.6-50 MG PO TABS
2.0000 | ORAL_TABLET | Freq: Every day | ORAL | 0 refills | Status: AC
  Filled 2022-10-10: qty 60, 30d supply, fill #0

## 2022-10-10 MED ORDER — BENZOCAINE-MENTHOL 20-0.5 % EX AERO
1.0000 | INHALATION_SPRAY | CUTANEOUS | 3 refills | Status: AC | PRN
  Filled 2022-10-10: qty 78, fill #0

## 2022-10-10 NOTE — Lactation Note (Signed)
This note was copied from a baby's chart. Lactation Consultation Note  Patient Name: Jill Schwartz [id number]'[id number] Date: 10/10/2022 Reason for consult: Follow-up assessment;Primapara;1st time breastfeeding;Breast reduction;Term Age:25 hours  LC in to visit with P1 Mom of term baby on day of discharge.  Baby at 3% weight loss.  Mom has been combo breast and formula feeding.  Mom has a history of bilateral breast reduction surgery in 2019.    Mom very interested in stimulating her milk supply.  She has a hand's free pump, no name noted on pump.  Talked about the benefit of using a hospital grade DEBP to support a full milk supply.    LC set up DEBP and assisted Mom to pump using 21 mm flanges.  Mom has a hand pump to pre-pump to evert nipple for latching.  Mom states baby is latching and drinking formula by bottle.  Mom packed up and ready to leave, but was interested in double pumping.  LC referred and highly recommended renting a [employer] from gift shop for 2-4 weeks.  Talked about OP lactation f/u and the importance of this.  Pediatrician group has an IBCLC Research scientist (life sciences)) in their group and she will be seeing her Thursday 11/30.    Encouraged continued STS with baby, hand expression, latching to the breast and frequent pumping.    Engorgement prevention and treatment reviewed.  Mom aware of our OP lactation support available to her.     Lactation Tools Discussed/Used Tools: Pump;Flanges;Bottle;Shells Flange Size: 21 Breast pump type: Double-Electric Breast Pump Pump Education: Milk Storage;Setup, frequency, and cleaning Reason for Pumping: support milk supply/bilateral breast reduction surgery Pumping frequency: Encouraged Mom to pump every 2-3 hrs during the day, after breastfeeding and 3-4 hrs at night.  Interventions Interventions: Breast feeding basics reviewed;Skin to skin;Breast massage;Hand express;Pre-pump if needed;DEBP;Hand pump;Education;Pace  feeding  Discharge Discharge Education: Engorgement and breast care;Warning signs for feeding baby;Outpatient recommendation Pump: Hands Free;Personal;Manual;Refer for rental  Consult Status Consult Status: Complete (mother declined follow up) [name] RN IBCLC at Pediatric group will consult) Date: 10/10/22 Follow-up type: Call as needed    [name] 10/10/2022, 1:59 PM

## 2022-10-10 NOTE — Lactation Note (Addendum)
This note was copied from a baby's chart. Lactation Consultation Note  Patient Name: Boy Divya Munshi [id number] Date: 10/10/2022  Birth Parent had Bilateral mammary reduction see Birth Parent's MR. Age:25 hours Infant had 4 voids and 3 stools since birth. Birth Parent's current feeding choice is breast and formula feeding infant.  Birth Parent, requested LC services, when [facility] entered room Birth Parent had  recently given infant 20 mls of formula. LC unable to observe latch at this time due infant recently being formula fed.  LC reviewed how to use hand pump to pre-pump breast prior to latching infant and Birth Parent's nipples everted completely outward using hand pump and reverse pressure softening. Birth Parent hand expressed and was excited to see colostrum. Birth Parent has been wearing breast shells in bra and understands not to sleep in them. Birth Parent plans to pre-pump breast with hand pump and do reverse pressure softening when infant latches again at the next feeding and knows to ask RN for latch assistance if needed. Birth Parent plans to latch infant first for every feeding and afterwards supplement infant with formula this is her current feeding choice.  Maternal Data    Feeding Nipple Type: Extra Slow Flow  LATCH Score                    Lactation Tools Discussed/Used    Interventions    Discharge    Consult Status      Frederico Hamman 10/10/2022, 2:43 AM

## 2022-10-10 NOTE — Progress Notes (Signed)
Circumcision Consent  Discussed with mom at bedside about circumcision.   Circumcision is a surgery that removes the skin that covers the tip of the penis, called the "foreskin." Circumcision is usually done when a boy is between 38 and 29 days old, sometimes up to 53-8 weeks old.  The most common reasons boys are circumcised include for cultural/religious beliefs or for parental preference (potentially easier to clean, so baby looks like daddy, etc).  There may be some medical benefits for circumcision:   Circumcised boys seem to have slightly lower rates of: ? Urinary tract infections (per the American Academy of Pediatrics an uncircumcised boy has a 1/100 chance of developing a UTI in the first year of life, a circumcised boy at a 11/998 chance of developing a UTI in the first year of life- a 10% reduction) ? Penis cancer (typically rare- an uncircumcised female has a 1 in 100,000 chance of developing cancer of the penis) ? Sexually transmitted infection (in endemic areas, including HIV, HPV and Herpes- circumcision does NOT protect against gonorrhea, chlamydia, trachomatis, or syphilis) ? Phimosis: a condition where that makes retraction of the foreskin over the glans impossible (0.4 per 1000 boys per year or 0.6% of boys are affected by their 15th birthday)  Boys and men who are not circumcised can reduce these extra risks by: ? Cleaning their penis well ? Using condoms during sex  What are the risks of circumcision?  As with any surgical procedure, there are risks and complications. In circumcision, complications are rare and usually minor, the most common being: ? Bleeding- risk is reduced by holding each clamp for 30 seconds prior to a cut being made, and by holding pressure after the procedure is done ? Infection- the penis is cleaned prior to the procedure, and the procedure is done under sterile technique ? Damage to the urethra or amputation of the penis  How is circumcision done  in baby boys?  The baby will be placed on a special table and the legs restrained for their safety. Numbing medication is injected into the penis, and the skin is cleansed with betadine to decrease the risk of infection.   What to expect:  The penis will look red and raw for 5-7 days as it heals. We expect scabbing around where the cut was made, as well as clear-pink fluid and some swelling of the penis right after the procedure. If your baby's circumcision starts to bleed or develops pus, please contact your pediatrician immediately.  All questions were answered and mother consented.  Celedonio Savage, MD 7:45 AM

## 2022-10-10 NOTE — Social Work (Signed)
MOB was referred for history of depression/anxiety.  * Referral screened out by Clinical Social Worker because none of the following criteria appear to apply:  ~ History of anxiety/depression during this pregnancy, or of post-partum depression following prior delivery.  ~ Diagnosis of anxiety and/or depression within last 3 years OR * MOB's symptoms currently being treated with medication and/or therapy. Pr chart review, MOB sees psychiatrist who prescribes her medication.  Please contact the Clinical Social Worker if needs arise or by MOB request.  Addysin Porco, LCSWA Clinical Social Worker [phone] 

## 2022-10-11 ENCOUNTER — Other Ambulatory Visit: Payer: Medicaid Other

## 2022-10-17 ENCOUNTER — Telehealth (HOSPITAL_COMMUNITY): Payer: Self-pay | Admitting: *Deleted

## 2022-10-17 DIAGNOSIS — Z1331 Encounter for screening for depression: Secondary | ICD-10-CM

## 2022-10-17 NOTE — Telephone Encounter (Signed)
Hospital Discharge Follow-Up Call:  Patient reports that she is well an has no concerns about her healing process.  EPDS today was 10 and she endorses having some anxiety.  She says that she had generalized anxiety before pregnancy, so she is not surprised that she is experiencing it now.  She has a therapist that she can see on an as needed basis.  Patient says that baby is well and she has no concerns about baby's health.  She reports that baby sleeps in a bassinet.  Reviewed ABCs of Safe Sleep.

## 2022-11-08 ENCOUNTER — Ambulatory Visit (INDEPENDENT_AMBULATORY_CARE_PROVIDER_SITE_OTHER): Payer: Medicaid Other | Admitting: Family Medicine

## 2022-11-08 NOTE — Progress Notes (Signed)
Post Partum Visit Note  Jill Schwartz is a 26 y.o. G71P1001 female who presents for a postpartum visit. She is 4 weeks postpartum following a normal spontaneous vaginal delivery.  I have fully reviewed the prenatal and intrapartum course. The delivery was at 40 gestational weeks.  Anesthesia: epidural. Postpartum course has been normal. Baby is doing well. Baby is feeding by breast. Bleeding no bleeding. Bowel function is normal. Bladder function is normal. Patient is not sexually active. Contraception method is oral progesterone-only contraceptive. Postpartum depression screening: negative.   Upstream - 11/08/22 1134       Pregnancy Intention Screening   Does the patient want to become pregnant in the next year? No    Does the patient's partner want to become pregnant in the next year? No    Would the patient like to discuss contraceptive options today? No      Contraception Wrap Up   Current Method Oral Contraceptive    End Method Oral Contraceptive    Contraception Counseling Provided No    How was the end contraceptive method provided? N/A            The pregnancy intention screening data noted above was reviewed. Potential methods of contraception were discussed. The patient elected to proceed with Oral Contraceptive.   Edinburgh Postnatal Depression Scale - 11/08/22 1135       Edinburgh Postnatal Depression Scale:  In the Past 7 Days   I have been able to laugh and see the funny side of things. 0    I have looked forward with enjoyment to things. 0    I have blamed myself unnecessarily when things went wrong. 1    I have been anxious or worried for no good reason. 2    I have felt scared or panicky for no good reason. 1    Things have been getting on top of me. 1    I have been so unhappy that I have had difficulty sleeping. 1    I have felt sad or miserable. 0    I have been so unhappy that I have been crying. 0    The thought of harming myself has occurred to  me. 0    Edinburgh Postnatal Depression Scale Total 6             Health Maintenance Due  Topic Date Due   COVID-19 Vaccine (1) Never done   HPV VACCINES (1 - 2-dose series) Never done   INFLUENZA VACCINE  06/13/2022    The following portions of the patient's history were reviewed and updated as appropriate: allergies, current medications, past family history, past medical history, past social history, past surgical history, and problem list.  Review of Systems Pertinent items are noted in HPI.  Objective:  BP 128/74   Pulse 90   Wt 174 lb (78.9 kg)   LMP 01/01/2022   Breastfeeding Yes   BMI 28.96 kg/m    General:  alert, cooperative, and no distress   Breasts:  not indicated  Lungs: clear to auscultation bilaterally  Heart:  regular rate and rhythm, S1, S2 normal, no murmur, click, rub or gallop  Abdomen: soft, non-tender; bowel sounds normal; no masses,  no organomegaly   Wound N/a  GU exam:  not indicated       Assessment:   1. Postpartum care and examination   Plan:   Essential components of care per ACOG recommendations:  1.  Mood and well being:  Patient with negative depression screening today. Reviewed local resources for support.  - Patient tobacco use? No.   - hx of drug use? No.    2. Infant care and feeding:  -Patient currently breastmilk feeding? Yes. Reviewed importance of draining breast regularly to support lactation.  -Social determinants of health (SDOH) reviewed in EPIC. No concerns  3. Sexuality, contraception and birth spacing - Patient does not want a pregnancy in the next year.   - Reviewed reproductive life planning. Reviewed contraceptive methods based on pt preferences and effectiveness.  Patient desired Oral Contraceptive today.   - Discussed birth spacing of 18 months  4. Sleep and fatigue -Encouraged family/partner/community support of 4 hrs of uninterrupted sleep to help with mood and fatigue  5. Physical Recovery  -  Discussed patients delivery and complications. She describes her labor as good. - Patient had a Vaginal, no problems at delivery. Patient had a 2nd degree laceration. Perineal healing reviewed. Patient expressed understanding - Patient has urinary incontinence? No. - Patient is safe to resume physical and sexual activity  6.  Health Maintenance - HM due items addressed Yes - Last pap smear  Diagnosis  Date Value Ref Range Status  03/15/2022   Final   - Negative for intraepithelial lesion or malignancy (NILM)   Pap smear not done at today's visit.  -Breast Cancer screening indicated? No.   7. Chronic Disease/Pregnancy Condition follow up: None  - PCP follow up  Levie Heritage, DO [facility], [facility]

## 2022-11-22 ENCOUNTER — Encounter: Payer: Self-pay | Admitting: Family Medicine

## 2022-11-30 ENCOUNTER — Telehealth (HOSPITAL_COMMUNITY): Payer: Self-pay | Admitting: Lactation Services

## 2022-11-30 NOTE — Telephone Encounter (Signed)
Mother of 60 month old infant, Jill Schwartz, called with concern of decreased milk supply. She wants to start a supplement called Liquid Gold to increase her milk production. Informed that I was not familiar with the supplement and offered other ways to increase milk production. Currently mother is pumping 4 times per day getting 12-13 oz/day which is  a decrease from 32 oz/ day, on average. Mother is exclusively pumping. Instructed to pump 8 times in 24 hrs, resume skin to skin with baby, increase water intake and informed of [place] breastfeeding support group. Baby is gaining weight with breast milk and formula.

## 2022-12-12 ENCOUNTER — Emergency Department (HOSPITAL_BASED_OUTPATIENT_CLINIC_OR_DEPARTMENT_OTHER): Payer: Medicaid Other

## 2022-12-12 ENCOUNTER — Emergency Department (HOSPITAL_BASED_OUTPATIENT_CLINIC_OR_DEPARTMENT_OTHER)
Admission: EM | Admit: 2022-12-12 | Discharge: 2022-12-12 | Disposition: A | Discharge status: Home / Self Care | Payer: Medicaid Other | Attending: Emergency Medicine | Admitting: Emergency Medicine

## 2022-12-12 ENCOUNTER — Other Ambulatory Visit: Payer: Self-pay

## 2022-12-12 ENCOUNTER — Encounter (HOSPITAL_BASED_OUTPATIENT_CLINIC_OR_DEPARTMENT_OTHER): Payer: Self-pay | Admitting: Emergency Medicine

## 2022-12-12 DIAGNOSIS — G43809 Other migraine, not intractable, without status migrainosus: Secondary | ICD-10-CM | POA: Insufficient documentation

## 2022-12-12 DIAGNOSIS — R519 Headache, unspecified: Secondary | ICD-10-CM | POA: Diagnosis present

## 2022-12-12 LAB — PREGNANCY, URINE: Preg Test, Ur: NEGATIVE

## 2022-12-12 MED ORDER — KETOROLAC TROMETHAMINE 30 MG/ML IJ SOLN: 30.0000 mg | Freq: Once | INTRAMUSCULAR | Status: DC

## 2022-12-12 MED ORDER — ACETAMINOPHEN 500 MG PO TABS
1000.0000 mg | ORAL_TABLET | Freq: Once | ORAL | Status: AC
  Administered 2022-12-12: 1000 mg via ORAL
  Filled 2022-12-12: qty 2

## 2022-12-12 MED ORDER — MAGNESIUM SULFATE 2 GM/50ML IV SOLN
2.0000 g | Freq: Once | INTRAVENOUS | Status: AC
  Administered 2022-12-12: 2 g via INTRAVENOUS
  Filled 2022-12-12: qty 50

## 2022-12-12 MED ORDER — SODIUM CHLORIDE 0.9 % IV BOLUS
500.0000 mL | Freq: Once | INTRAVENOUS | Status: AC
  Administered 2022-12-12: 500 mL via INTRAVENOUS

## 2022-12-12 NOTE — ED Triage Notes (Signed)
Pt is c/o migraine headache with numbness on the right side of her face  Pt states the migraine is mainly in the front of her head  Pt states sxs started last week

## 2022-12-12 NOTE — ED Provider Notes (Signed)
[facility] Provider Note   CSN: [id number] Arrival date & time: 12/12/22  0443     History  Chief Complaint  Patient presents with   Migraine    Jill Schwartz is a 27 y.o. female.  The history is provided by the patient.  Migraine This is a recurrent problem. The current episode started more than 1 week ago. The problem occurs constantly. The problem has not changed since onset.Associated symptoms include headaches. Pertinent negatives include no chest pain, no abdominal pain and no shortness of breath. Nothing aggravates the symptoms. Nothing relieves the symptoms. She has tried acetaminophen for the symptoms. The treatment provided no relief.  Patient with a frontal headache consistent with previous migraines that started last week.  Is breast feeding and told she can only take tylenol.       Home Medications Prior to Admission medications   Medication Sig Start Date End Date Taking? Authorizing Provider  benzocaine-Menthol (DERMOPLAST) 20-0.5 % AERO Apply 1 Application topically as needed for irritation (perineal discomfort). Patient not taking: Reported on 11/08/2022 10/10/22   Concepcion Living, MD  diphenhydrAMINE (BENADRYL) 25 MG tablet Take 25 mg by mouth every 6 (six) hours as needed for allergies. Patient not taking: Reported on 11/08/2022    [provider]  lidocaine (XYLOCAINE) 2 % jelly Apply 1 Application topically as needed. Patient not taking: Reported on 07/25/2022 07/24/22   Deloris Ping, CNM  norethindrone (MICRONOR) 0.35 MG tablet Take 1 tablet by mouth daily. 10/10/22   Concepcion Living, MD  Prenatal Vit-Fe Fumarate-FA (PRENATAL VITAMINS) 28-0.8 MG TABS Take 1 tablet by mouth daily. Patient not taking: Reported on 11/08/2022 04/13/22   Woodroe Mode, MD  senna-docusate (SENOKOT-S) 8.6-50 MG tablet Take 2 tablets by mouth daily. Patient not taking: Reported on 11/08/2022 10/10/22   Concepcion Living, MD  terconazole (TERAZOL 7) 0.4 % vaginal cream Insert 1 applicator intravaginally every night for 3 days. Patient not taking: Reported on 07/25/2022 07/03/22   Donnamae Jude, MD      Allergies    Penicillins and Metformin and related    Review of Systems   Review of Systems  Constitutional:  Negative for fever.  HENT:  Negative for facial swelling.   Eyes:  Negative for visual disturbance.  Respiratory:  Negative for shortness of breath.   Cardiovascular:  Negative for chest pain.  Gastrointestinal:  Negative for abdominal pain.  Musculoskeletal:  Negative for neck pain.  Neurological:  Positive for facial asymmetry and headaches. Negative for speech difficulty.  All other systems reviewed and are negative.   Physical Exam Updated Vital Signs BP (!) 137/98 (BP Location: Right Arm)   Pulse 86   Temp 98.3 F (36.8 C) (Oral)   Resp 16   Ht 5\' 5"  (1.651 m)   Wt 77.6 kg   LMP 12/08/2022 (Exact Date)   SpO2 100%   BMI 28.46 kg/m  Physical Exam Vitals and nursing note reviewed.  Constitutional:      General: She is not in acute distress.    Appearance: Normal appearance. She is well-developed.  HENT:     Head: Normocephalic and atraumatic.     Nose: Nose normal.     Mouth/Throat:     Mouth: Mucous membranes are moist.     Pharynx: Oropharynx is clear.  Eyes:     Pupils: Pupils are equal, round, and reactive to light.  Cardiovascular:  Rate and Rhythm: Normal rate and regular rhythm.     Pulses: Normal pulses.     Heart sounds: Normal heart sounds.  Pulmonary:     Effort: Pulmonary effort is normal. No respiratory distress.     Breath sounds: Normal breath sounds.  Abdominal:     General: Bowel sounds are normal. There is no distension.     Palpations: Abdomen is soft.     Tenderness: There is no abdominal tenderness. There is no guarding or rebound.  Genitourinary:    Vagina: No vaginal discharge.  Musculoskeletal:        General: Normal range of  motion.     Cervical back: Normal range of motion and neck supple. No rigidity.  Lymphadenopathy:     Cervical: No cervical adenopathy.  Skin:    General: Skin is dry.     Capillary Refill: Capillary refill takes less than 2 seconds.     Findings: No erythema or rash.  Neurological:     General: No focal deficit present.     Mental Status: She is alert and oriented to person, place, and time.     Cranial Nerves: No cranial nerve deficit.     Deep Tendon Reflexes: Reflexes normal.  Psychiatric:        Mood and Affect: Mood normal.     ED Results / Procedures / Treatments   Labs (all labs ordered are listed, but only abnormal results are displayed) Labs Reviewed  PREGNANCY, URINE    EKG None  Radiology CT Head Wo Contrast  Result Date: 12/12/2022 CLINICAL DATA:  Migraine headache and right-sided facial numbness. EXAM: CT HEAD WITHOUT CONTRAST TECHNIQUE: Contiguous axial images were obtained from the base of the skull through the vertex without intravenous contrast. RADIATION DOSE REDUCTION: This exam was performed according to the departmental dose-optimization program which includes automated exposure control, adjustment of the mA and/or kV according to patient size and/or use of iterative reconstruction technique. COMPARISON:  None Available. FINDINGS: Brain: No evidence of acute infarction, hemorrhage, hydrocephalus, extra-axial collection or mass lesion/mass effect. Vascular: No hyperdense vessel or unexpected calcification. Skull: Normal. Negative for fracture or focal lesion. Sinuses/Orbits: A 12 mm x 6 mm left maxillary sinus polyp versus mucous retention cyst is seen. Other: None. IMPRESSION: 1. No acute intracranial abnormality. Electronically Signed   By: Virgina Norfolk M.D.   On: 12/12/2022 05:23    Procedures Procedures    Medications Ordered in ED Medications  magnesium sulfate IVPB 2 g 50 mL (2 g Intravenous New Bag/Given 12/12/22 0532)  sodium chloride 0.9 %  bolus 500 mL (500 mLs Intravenous New Bag/Given 12/12/22 0532)  acetaminophen (TYLENOL) tablet 1,000 mg (1,000 mg Oral Given 12/12/22 0536)    ED Course/ Medical Decision Making/ A&P                             Medical Decision Making Migraine x 1 week   Amount and/or Complexity of Data Reviewed Independent Historian: parent    Details: See above  External Data Reviewed: notes.    Details: Previous notes reviewed  Labs: ordered.    Details: Pregnancy is negative  Radiology: ordered and independent interpretation performed.    Details: Negative head CT  Risk OTC drugs. Prescription drug management. Risk Details: Well appearing with normal exam and normal head CT.  Markedly improved post medication.  Stable for discharge with close follow up. Strict return.    } Final  Clinical Impression(s) / ED Diagnoses Final diagnoses:  None   Return for intractable cough, coughing up blood, fevers > 100.4 unrelieved by medication, shortness of breath, intractable vomiting, chest pain, shortness of breath, weakness, numbness, changes in speech, facial asymmetry, abdominal pain, passing out, Inability to tolerate liquids or food, cough, altered mental status or any concerns. No signs of systemic illness or infection. The patient is nontoxic-appearing on exam and vital signs are within normal limits.  I have reviewed the triage vital signs and the nursing notes. Pertinent labs & imaging results that were available during my care of the patient were reviewed by me and considered in my medical decision making (see chart for details). After history, exam, and medical workup I feel the patient has been appropriately medically screened and is safe for discharge home. Pertinent diagnoses were discussed with the patient. Patient was given return precautions. Rx / DC Orders ED Discharge Orders     None         Juna Caban, MD 12/12/22 1610

## 2022-12-12 NOTE — ED Notes (Signed)
Pt to CT

## 2022-12-22 ENCOUNTER — Other Ambulatory Visit: Payer: Self-pay

## 2022-12-22 MED ORDER — NORETHINDRONE 0.35 MG PO TABS: ORAL_TABLET | Freq: Every day | ORAL | 11 refills | Status: DC

## 2022-12-22 NOTE — Progress Notes (Signed)
Birth control refill. Kathrene Alu RN

## 2023-11-19 IMAGING — US US OB < 14 WEEKS - US OB TV
1 series · 15 of 28 positions shown · non-contrast
Comparison: None.

CLINICAL DATA: Cramping for 1 week

EXAM:
OBSTETRIC <14 WK US AND TRANSVAGINAL OB US
TECHNIQUE: Both transabdominal and transvaginal ultrasound examinations were
performed for complete evaluation of the gestation as well as the
maternal uterus, adnexal regions, and pelvic cul-de-sac.
Transvaginal technique was performed to assess early pregnancy.

[Series 1: us ob < 14 weeks - us ob tv · 56 acquisitions, 15 frames shown]
[im 1/56]
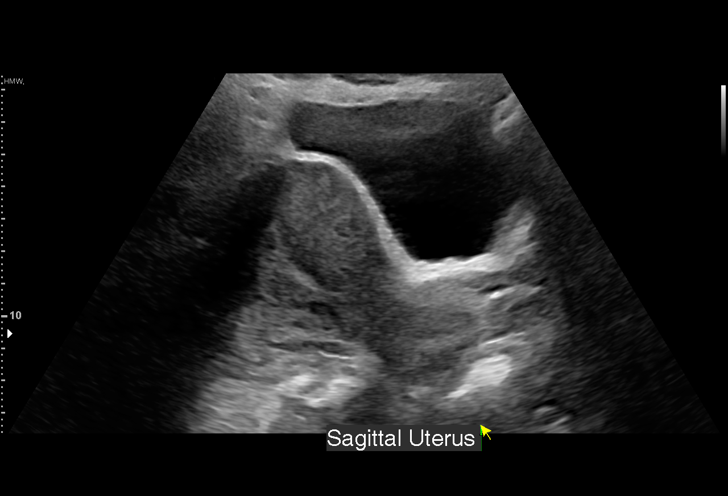
[im 5/56]
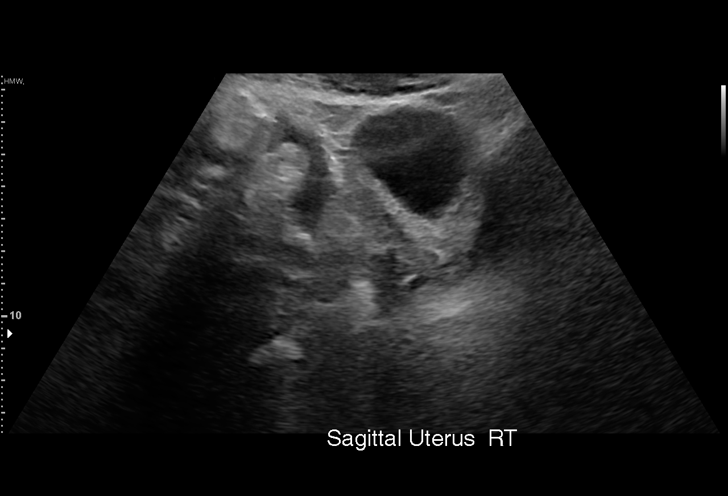
[im 9/56]
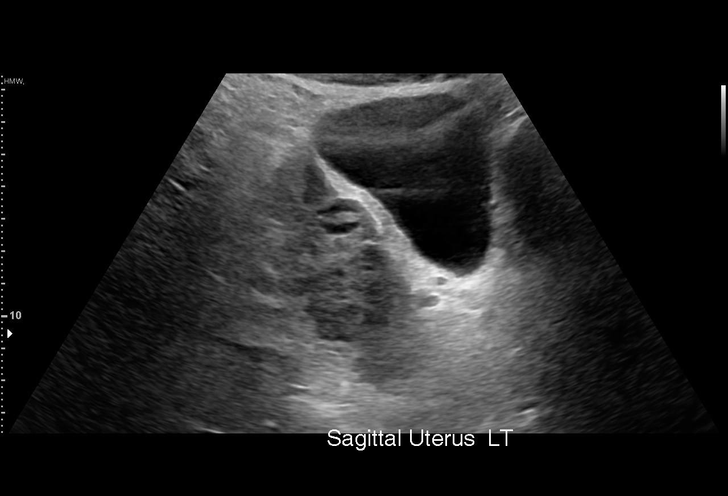
[im 13/56]
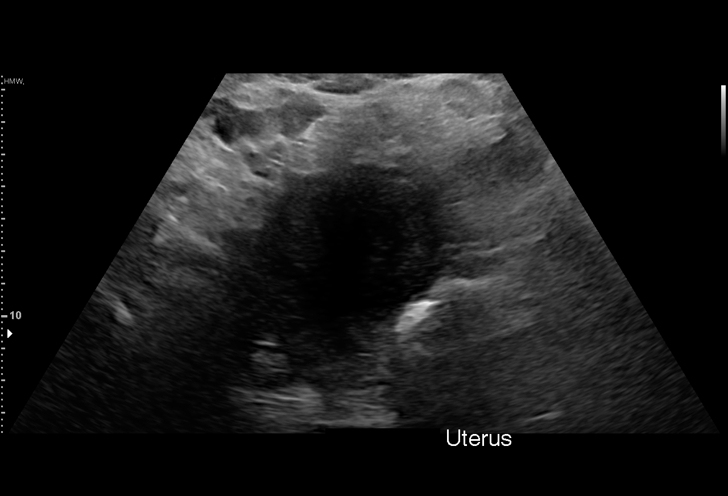
[im 17/56]
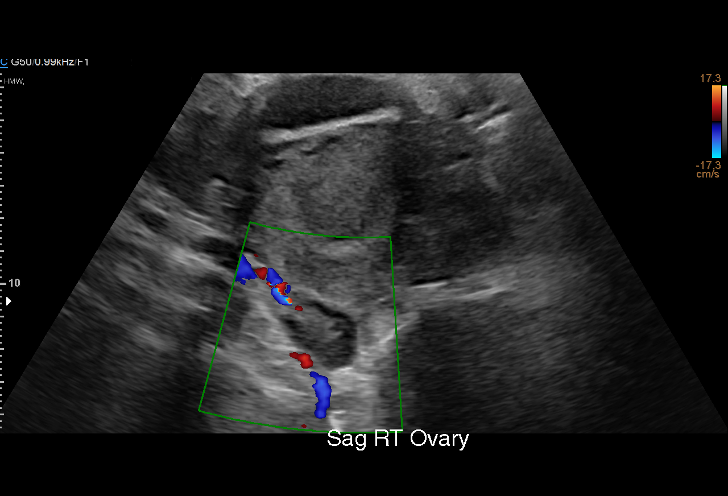
[im 21/56]
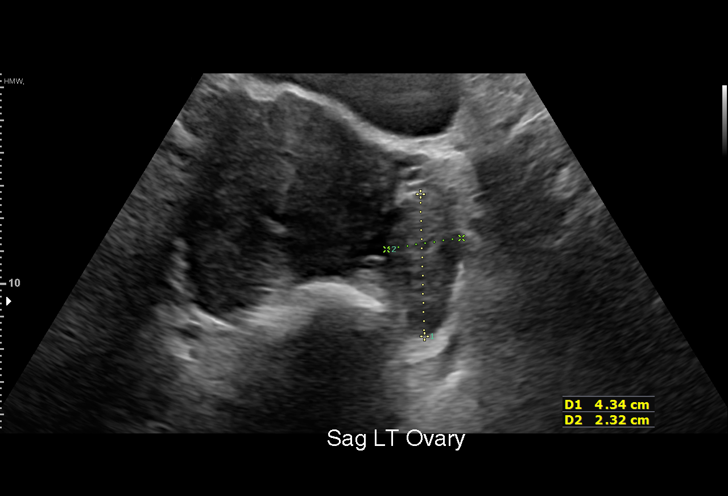
[im 25/56]
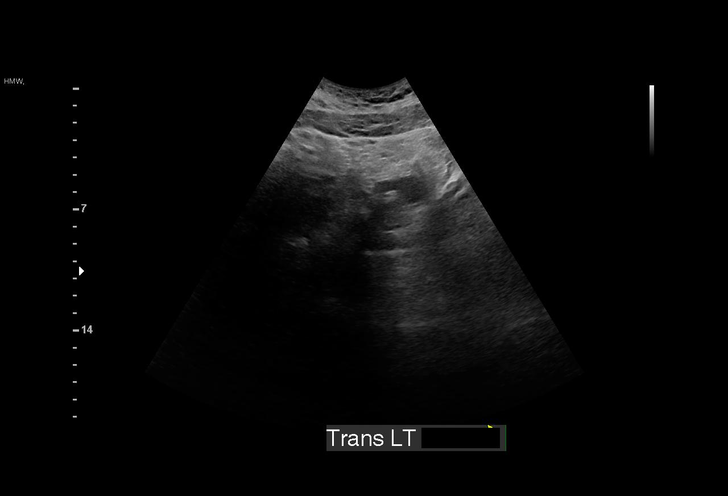
[im 29/56]
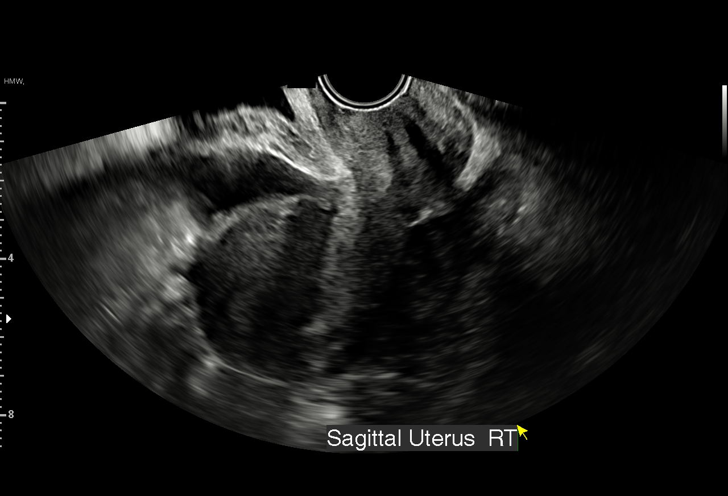
[im 31/56]
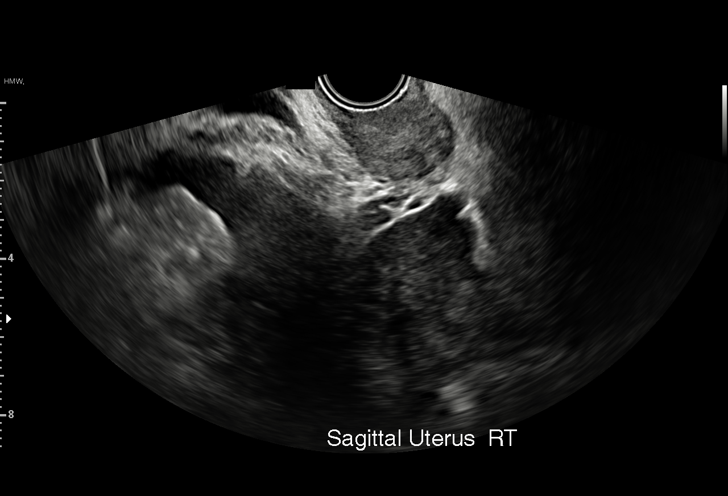
[im 35/56]
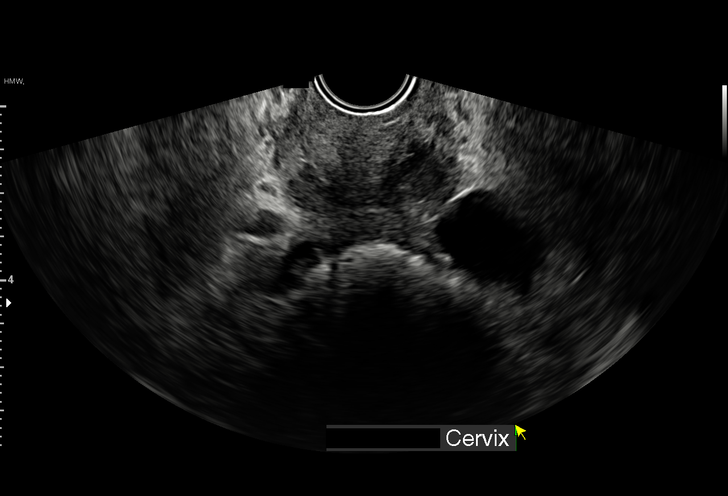
[im 39/56]
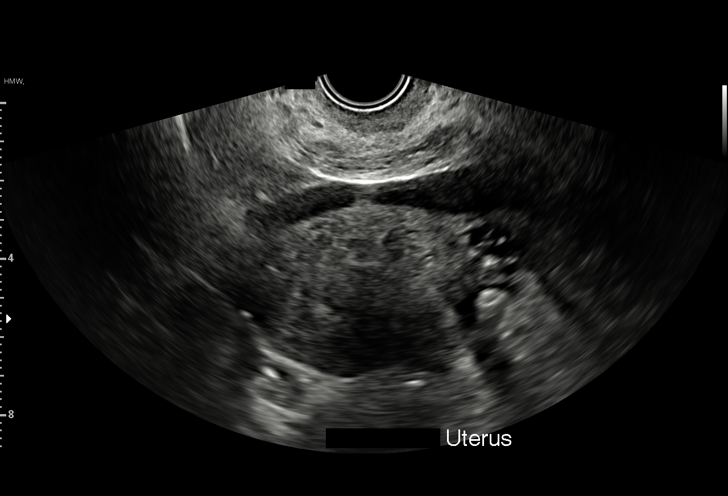
[im 43/56]
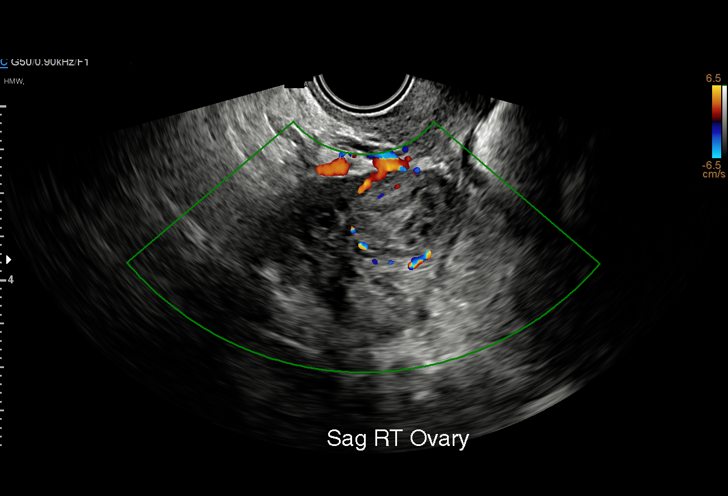
[im 47/56]
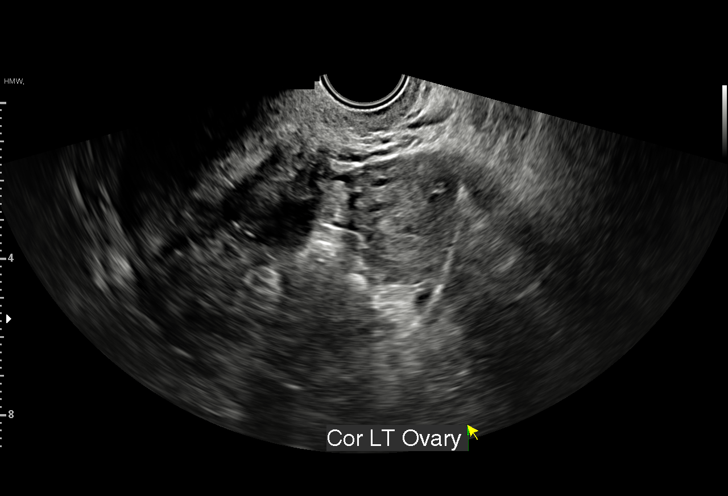
[im 51/56]
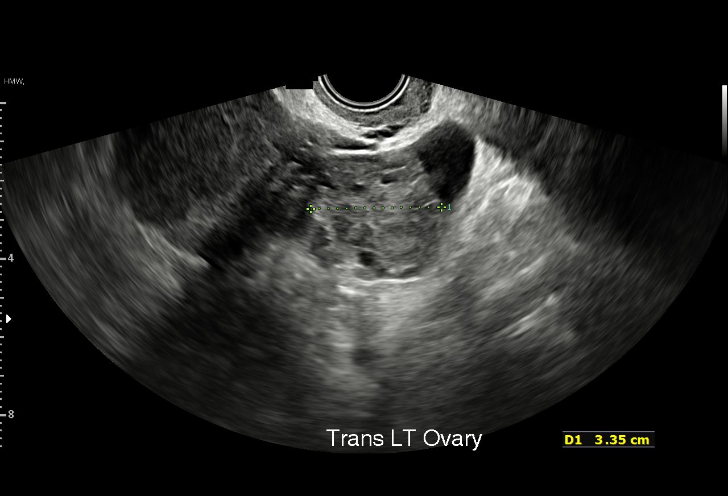
[im 56/56]
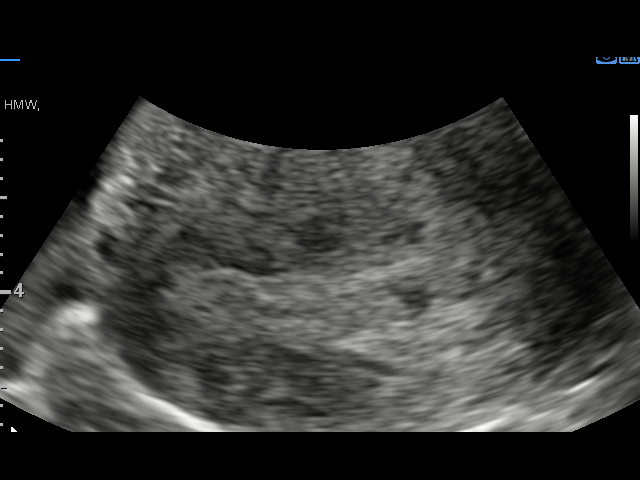

[15 of 28 positions shown; findings below may reference images not displayed]

FINDINGS: Intrauterine gestational sac: Small fluid collection which probably
represents a small gestational sac.

Yolk sac:  Not Visualized.

Embryo:  Not Visualized.

Cardiac Activity: Not Visualized.

Heart Rate: Not applicable

MSD: 3.5 mm mm   5 w   0 d

Subchorionic hemorrhage:  None visualized.

Maternal uterus/adnexae: Normal ovaries.

Small volume free fluid in the pelvis.
IMPRESSION: Probable early intrauterine gestational sac, but no yolk sac, fetal
pole, or cardiac activity yet visualized. Small volume, nonspecific
free fluid in the pelvis. Recommend follow-up quantitative B-HCG
levels and follow-up US in 14 days to assess viability. This
recommendation follows SRU consensus guidelines: Diagnostic Criteria
for Nonviable Pregnancy Early in the First Trimester. N Engl J Med

## 2023-12-11 ENCOUNTER — Emergency Department (HOSPITAL_BASED_OUTPATIENT_CLINIC_OR_DEPARTMENT_OTHER)
Admission: EM | Admit: 2023-12-11 | Discharge: 2023-12-11 | Disposition: A | Discharge status: Home / Self Care | Payer: Medicaid Other | Attending: Emergency Medicine | Admitting: Emergency Medicine

## 2023-12-11 ENCOUNTER — Encounter (HOSPITAL_BASED_OUTPATIENT_CLINIC_OR_DEPARTMENT_OTHER): Payer: Self-pay | Admitting: Emergency Medicine

## 2023-12-11 ENCOUNTER — Emergency Department (HOSPITAL_BASED_OUTPATIENT_CLINIC_OR_DEPARTMENT_OTHER): Payer: Medicaid Other

## 2023-12-11 ENCOUNTER — Other Ambulatory Visit: Payer: Self-pay

## 2023-12-11 DIAGNOSIS — R202 Paresthesia of skin: Secondary | ICD-10-CM | POA: Diagnosis not present

## 2023-12-11 DIAGNOSIS — R0789 Other chest pain: Secondary | ICD-10-CM | POA: Insufficient documentation

## 2023-12-11 LAB — BASIC METABOLIC PANEL
Anion gap: 7 (ref 5–15)
BUN: 13 mg/dL (ref 6–20)
CO2: 25 mmol/L (ref 22–32)
Calcium: 9.4 mg/dL (ref 8.9–10.3)
Chloride: 103 mmol/L (ref 98–111)
Creatinine, Ser: 0.89 mg/dL (ref 0.44–1.00)
GFR, Estimated: >60 mL/min (ref >60)
Glucose, Bld: 102 mg/dL — ABNORMAL HIGH (ref 70–99)
Potassium: 3.9 mmol/L (ref 3.5–5.1)
Sodium: 135 mmol/L (ref 135–145)

## 2023-12-11 LAB — CBC WITH DIFFERENTIAL/PLATELET
Abs Immature Granulocytes: 0.01 10*3/uL (ref 0.00–0.07)
Basophils Absolute: 0 10*3/uL (ref 0.0–0.1)
Basophils Relative: 0 %
Eosinophils Absolute: 0.2 10*3/uL (ref 0.0–0.5)
Eosinophils Relative: 2 %
HCT: 40.7 % (ref 36.0–46.0)
Hemoglobin: 13.8 g/dL (ref 12.0–15.0)
Immature Granulocytes: 0 %
Lymphocytes Relative: 50 %
Lymphs Abs: 3.8 10*3/uL (ref 0.7–4.0)
MCH: 29.1 pg (ref 26.0–34.0)
MCHC: 33.9 g/dL (ref 30.0–36.0)
MCV: 85.7 fL (ref 80.0–100.0)
Monocytes Absolute: 0.5 10*3/uL (ref 0.1–1.0)
Monocytes Relative: 7 %
Neutro Abs: 3.1 10*3/uL (ref 1.7–7.7)
Neutrophils Relative %: 41 %
Platelets: 404 10*3/uL — ABNORMAL HIGH (ref 150–400)
RBC: 4.75 MIL/uL (ref 3.87–5.11)
RDW: 13.2 % (ref 11.5–15.5)
WBC: 7.6 10*3/uL (ref 4.0–10.5)
nRBC: 0 % (ref 0.0–0.2)

## 2023-12-11 LAB — D-DIMER, QUANTITATIVE: D-Dimer, Quant: 0.3 ug{FEU}/mL (ref 0.00–0.50)

## 2023-12-11 LAB — TROPONIN I (HIGH SENSITIVITY): Troponin I (High Sensitivity): 2 ng/L (ref <18)

## 2023-12-11 NOTE — ED Provider Notes (Signed)
[facility] Provider Note   CSN: [id number] Arrival date & time: 12/11/23  2025     History  Chief Complaint  Patient presents with   Numbness   Chest Pain    Jill Schwartz is a 28 y.o. female who presents with chest pain and numbness in her right upper extremity  x 1 week.  There is no exertional component.  Denies any cough or congestion.  No associated dizziness or shortness of breath.  Denies blurry vision, double vision, difficulty ambulating.  Reports numbness around her right elbow down into her hand involving most of her digits.  States her symptoms have been been much constant since she slept on her arm.  No cardiac history or history of blood clots.  She is not currently pregnant.  Although she did recently cease breast-feeding.   Chest Pain      Home Medications Prior to Admission medications   Medication Sig Start Date End Date Taking? Authorizing Provider  benzocaine-Menthol (DERMOPLAST) 20-0.5 % AERO Apply 1 Application topically as needed for irritation (perineal discomfort). Patient not taking: Reported on 11/08/2022 10/10/22   Celedonio Savage, MD  diphenhydrAMINE (BENADRYL) 25 MG tablet Take 25 mg by mouth every 6 (six) hours as needed for allergies. Patient not taking: Reported on 11/08/2022    [provider]  lidocaine (XYLOCAINE) 2 % jelly Apply 1 Application topically as needed. Patient not taking: Reported on 07/25/2022 07/24/22   Carlynn Herald, CNM  norethindrone (MICRONOR) 0.35 MG tablet Take 1 tablet by mouth daily. 12/22/22   Levie Heritage, DO  Prenatal Vit-Fe Fumarate-FA (PRENATAL VITAMINS) 28-0.8 MG TABS Take 1 tablet by mouth daily. Patient not taking: Reported on 11/08/2022 04/13/22   Adam Phenix, MD  senna-docusate (SENOKOT-S) 8.6-50 MG tablet Take 2 tablets by mouth daily. Patient not taking: Reported on 11/08/2022 10/10/22   Celedonio Savage, MD  terconazole (TERAZOL 7) 0.4 %  vaginal cream Insert 1 applicator intravaginally every night for 3 days. Patient not taking: Reported on 07/25/2022 07/03/22   Reva Bores, MD      Allergies    Penicillins and Metformin and related    Review of Systems   Review of Systems  Cardiovascular:  Positive for chest pain.    Physical Exam Updated Vital Signs BP 129/87 (BP Location: Right Arm)   Pulse 100   Temp 98.4 F (36.9 C) (Oral)   Resp 16   Ht 5\' 5"  (1.651 m)   LMP 11/25/2023   SpO2 99%   Breastfeeding No   BMI 28.46 kg/m  Physical Exam Vitals and nursing note reviewed.  Constitutional:      General: She is not in acute distress.    Appearance: She is well-developed.  HENT:     Head: Normocephalic and atraumatic.  Eyes:     Conjunctiva/sclera: Conjunctivae normal.  Cardiovascular:     Rate and Rhythm: Normal rate and regular rhythm.     Heart sounds: No murmur heard. Pulmonary:     Effort: Pulmonary effort is normal. No respiratory distress.     Breath sounds: Normal breath sounds.  Chest:     Chest wall: No tenderness.  Abdominal:     Palpations: Abdomen is soft.     Tenderness: There is no abdominal tenderness.  Musculoskeletal:     Cervical back: Neck supple.     Comments: 4 out of 5 grip strength on the right upper extremity, intermittent tingling reported  to forearm extensor surface, full range of motion of upper extremity, negative Tinel's at carpal and cubital tunnels, motor function intact, motor function intact  Skin:    General: Skin is warm and dry.     Capillary Refill: Capillary refill takes less than 2 seconds.  Neurological:     Mental Status: She is alert and oriented to person, place, and time. Mental status is at baseline.     Cranial Nerves: No cranial nerve deficit.     Sensory: No sensory deficit.     Motor: No weakness.  Psychiatric:        Mood and Affect: Mood normal.     ED Results / Procedures / Treatments   Labs (all labs ordered are listed, but only abnormal  results are displayed) Labs Reviewed  BASIC METABOLIC PANEL - Abnormal; Notable for the following components:      Result Value   Glucose, Bld 102 (*)    All other components within normal limits  CBC WITH DIFFERENTIAL/PLATELET - Abnormal; Notable for the following components:   Platelets 404 (*)    All other components within normal limits  D-DIMER, QUANTITATIVE  PREGNANCY, URINE  TROPONIN I (HIGH SENSITIVITY)    EKG EKG Interpretation Date/Time:  Tuesday December 11 2023 20:42:10 EST Ventricular Rate:  101 PR Interval:  129 QRS Duration:  86 QT Interval:  333 QTC Calculation: 432 R Axis:   51  Text Interpretation: Sinus tachycardia Borderline T abnormalities, diffuse leads No acute changes No significant change since last tracing Confirmed by Derwood Kaplan [phone]) on 12/11/2023 10:48:57 PM  Radiology DG Chest 2 View Result Date: 12/11/2023 CLINICAL DATA:  Chest pain EXAM: CHEST - 2 VIEW COMPARISON:  None Available. FINDINGS: The heart size and mediastinal contours are within normal limits. Both lungs are clear. The visualized skeletal structures are unremarkable. IMPRESSION: No active cardiopulmonary disease. Electronically Signed   By: Darliss Cheney M.D.   On: 12/11/2023 20:59    Procedures Procedures    Medications Ordered in ED Medications - No data to display  ED Course/ Medical Decision Making/ A&P Clinical Course as of 12/11/23 2347  Tue Dec 11, 2023  2254 Troponin I (High Sensitivity) [JT]    Clinical Course User Index [JT] Halford Decamp, PA-C                                 Medical Decision Making Amount and/or Complexity of Data Reviewed Labs: ordered. Radiology: ordered.   This patient presents to the ED with chief complaint(s) of chest pain and arm numbness.  The complaint involves an extensive differential diagnosis and also carries with it a high risk of complications and morbidity.   pertinent past medical history as listed in HPI  The  differential diagnosis includes  ACS, PE, aortic dissection, pneumonia, pneumothorax, myocarditis, pericarditis, musculoskeletal, GERD, esophageal dissection, CVA, TIA, peripheral neuropathy, cervical radiculopathy The initial plan is to  Will start with basic labs, EKG, chest x-ray Additional history obtained: Additional history obtained from family Records reviewed previous admission documents and Care Everywhere/External Records  Initial Assessment:   Patient presents mildly tachycardic at 106 with complaints of chest pain and right arm numbness after sleeping on her arm.  She has no cough, she is afebrile.  Lung sounds are clear overall no suspicion for pneumonia.  Chest pain is not exertional and she has no cardiac history or risk factors overall no suspicion for  ACS.  No history of blood clot however she has recently discontinued breast-feeding she is on estrogen.  Given these risk factors and tachycardia present here we will obtain D-dimer.  No dysphagia or hematemesis no concern for esophageal dissection.  She does have have for a 4/5 grip strength on her right upper extremity.  Otherwise no other neurodeficits.  Motor function is intact.  No symptoms to suggest CVA or mass effect.  Overall her symptoms are most consistent with a peripheral neuropathy such as a very mild radial nerve palsy after sleeping on her right upper extremity.  Independent ECG interpretation:  Sinus rhythm with T wave inversion in leads III and aVF, unchanged from prior EKG  Independent labs interpretation:  The following labs were independently interpreted:  CBC and BMP without significant abnormality, troponin without elevation, dimer within normal limits  Independent visualization and interpretation of imaging: I independently visualized the following imaging with scope of interpretation limited to determining acute life threatening conditions related to emergency care: Chest x-ray, which revealed no  cardiopulmonary disease  Treatment and Reassessment: No medications administered during visit  Consultations obtained:   None  Disposition:   Patient will be discharged home.  Encouraged to follow-up with primary care doctor within the next week to ensure symptoms are improving. The patient has been appropriately medically screened and/or stabilized in the ED. I have low suspicion for any other emergent medical condition which would require further screening, evaluation or treatment in the ED or require inpatient management. At time of discharge the patient is hemodynamically stable and in no acute distress. I have discussed work-up results and diagnosis with patient and answered all questions. Patient is agreeable with discharge plan. We discussed strict return precautions for returning to the emergency department and they verbalized understanding.     Social Determinants of Health:   none  This note was dictated with voice recognition software.  Despite best efforts at proofreading, errors may have occurred which can change the documentation meaning.             Final Clinical Impression(s) / ED Diagnoses Final diagnoses:  Atypical chest pain  Paresthesias    Rx / DC Orders ED Discharge Orders     None         Fabienne Bruns 12/11/23 2347    Derwood Kaplan, MD 12/14/23 1724

## 2023-12-11 NOTE — ED Triage Notes (Signed)
Pt c/o LUE numbness x 1 wk; reports intermittent CP x 1 wk that starts on RT upper side and goes across to LT; NAD in triage

## 2023-12-11 NOTE — Discharge Instructions (Addendum)
You were evaluated in the emergency room for chest pain and arm numbness.  Your lab work, EKG and chest x-ray did not show any significant abnormality.  Your numbness in your right upper extremity is consistent with a peripheral neuropathy such as a very mild radial nerve palsy or carpal tunnel.  If you experience any new or worsening symptoms including dizziness, worsening chest pain, difficulty breathing, difficulty walking please return to the emergency room.  Otherwise please follow-up with your primary care doctor within the next week to ensure your symptoms are improving.

## 2024-01-16 ENCOUNTER — Ambulatory Visit: Payer: Medicaid Other | Admitting: Family Medicine

## 2024-01-16 VITALS — BP 113/71 | HR 98 | Ht 65.0 in | Wt 216.0 lb

## 2024-01-16 DIAGNOSIS — Z01419 Encounter for gynecological examination (general) (routine) without abnormal findings: Secondary | ICD-10-CM | POA: Diagnosis not present

## 2024-01-16 DIAGNOSIS — N939 Abnormal uterine and vaginal bleeding, unspecified: Secondary | ICD-10-CM | POA: Diagnosis not present

## 2024-01-16 DIAGNOSIS — Z1339 Encounter for screening examination for other mental health and behavioral disorders: Secondary | ICD-10-CM

## 2024-01-16 MED ORDER — NORGESTIMATE-ETH ESTRADIOL 0.25-35 MG-MCG PO TABS: 1.0000 | ORAL_TABLET | Freq: Every day | ORAL | 3 refills | Status: DC

## 2024-01-16 NOTE — Progress Notes (Addendum)
 ANNUAL EXAM Patient name: Jill Schwartz MRN [id number]  Date of birth: 25-Mar-1996 Chief Complaint:   Annual Exam  History of Present Illness:   Jill Schwartz is a 28 y.o.  [id number]  female  being seen today for a routine annual exam.  Current complaints: started having menses this past November. Had 2 week menses in November and December. Her period hasn't stopped since it started in January. No longer breastfeeding. Did try provera - had a virtual appt with a provider. It did not help.  Patient's last menstrual period was 12/17/2023.   Upstream - 01/16/24 1029       Pregnancy Intention Screening   Does the patient want to become pregnant in the next year? No    Does the patient's partner want to become pregnant in the next year? No    Would the patient like to discuss contraceptive options today? No      Contraception Wrap Up   Current Method No Method - Other Reason    Contraception Counseling Provided No    How was the end contraceptive method provided? N/A             Last pap 2023. Results were: NILM w/ HRHPV negative. H/O abnormal pap: no Last mammogram: n/a     01/16/2024   10:28 AM 07/20/2022   10:58 AM 03/15/2022    9:58 AM  Depression screen PHQ 2/9  Decreased Interest 0 0 0  Down, Depressed, Hopeless 1 0 1  PHQ - 2 Score 1 0 1  Altered sleeping 3 0 1  Tired, decreased energy 3 2 1   Change in appetite 0 0 1  Feeling bad or failure about yourself  0 0 1  Trouble concentrating 0 0 1  Moving slowly or fidgety/restless 0 0 0  Suicidal thoughts 0 0 0  PHQ-9 Score 7 2 6         01/16/2024   10:28 AM 07/20/2022   10:58 AM 03/15/2022    9:58 AM  GAD 7 : Generalized Anxiety Score  Nervous, Anxious, on Edge 2 2 3   Control/stop worrying 0 1 3  Worry too much - different things 0 0 2  Trouble relaxing 0 0 2  Restless 0 0 0  Easily annoyed or irritable 1 0 3  Afraid - awful might happen 0 1 3  Total GAD 7 Score 3 4 16   Anxiety Difficulty   Somewhat difficult      Review of Systems:   Pertinent items are noted in HPI Denies any headaches, blurred vision, fatigue, shortness of breath, chest pain, abdominal pain, abnormal vaginal discharge/itching/odor/irritation, problems with periods, bowel movements, urination, or intercourse unless otherwise stated above. Pertinent History Reviewed:  Reviewed past medical,surgical, social and family history.  Reviewed problem list, medications and allergies. Physical Assessment:   Vitals:   01/16/24 1020  BP: 113/71  Pulse: 98  Weight: 216 lb (98 kg)  Height: 5\' 5"  (1.651 m)  Body mass index is 35.94 kg/m.        Physical Examination:   General appearance - well appearing, and in no distress  Mental status - alert, oriented to person, place, and time  Psych:  She has a normal mood and affect  Skin - warm and dry, normal color, no suspicious lesions noted  Chest - effort normal, all lung fields clear to auscultation bilaterally  Heart - normal rate and regular rhythm  Neck:  midline trachea, no thyromegaly or nodules  Breasts -  Has normal scarring bilaterally from previous breast reduction. Appear normal, no suspicious masses, no skin or nipple changes or axillary nodes  Abdomen - soft, nontender, nondistended, no masses or organomegaly  Pelvic - deferred by patient due to bleeding.  Extremities:  No swelling or varicosities noted  Chaperone present for exam  Assessment & Plan:  1. Abnormal uterine bleeding (AUB) (Primary) Start COC taper. Check [place]. F/u in 3 months. Patient to call if bleeding doesn't stop in 2 weeks. - US PELVIC COMPLETE WITH TRANSVAGINAL; Future  2. Well woman exam with routine gynecological exam Cervical cancer screening current    Labs/procedures today:   Orders Placed This Encounter  Procedures   US PELVIC COMPLETE WITH TRANSVAGINAL    Meds:  Meds ordered this encounter  Medications   norgestimate-ethinyl estradiol (ORTHO-CYCLEN) 0.25-35 MG-MCG tablet    Sig:  Take 1 tablet by mouth daily. Take 3 tablets for 3 days, then 2 tablets daily for 3 days, then 1 tablet daily. Skip nonhormone days and start a new pack.    Dispense:  90 tablet    Refill:  3    Follow-up: No follow-ups on file.  Levie Heritage, DO 01/16/2024 11:58 AM

## 2024-01-19 ENCOUNTER — Ambulatory Visit (HOSPITAL_BASED_OUTPATIENT_CLINIC_OR_DEPARTMENT_OTHER)
Admission: RE | Admit: 2024-01-19 | Discharge: 2024-01-19 | Disposition: A | Discharge status: Home / Self Care | Source: Ambulatory Visit | Attending: Family Medicine | Admitting: Family Medicine

## 2024-01-19 DIAGNOSIS — N939 Abnormal uterine and vaginal bleeding, unspecified: Secondary | ICD-10-CM | POA: Insufficient documentation

## 2024-02-15 ENCOUNTER — Encounter: Payer: Self-pay | Admitting: Family Medicine

## 2024-12-16 ENCOUNTER — Ambulatory Visit

## 2024-12-16 VITALS — BP 126/71 | HR 99 | Ht 65.0 in | Wt 204.0 lb

## 2024-12-16 DIAGNOSIS — Z9889 Other specified postprocedural states: Secondary | ICD-10-CM

## 2024-12-16 LAB — POCT URINE PREGNANCY: Preg Test, Ur: POSITIVE — AB

## 2024-12-16 MED ORDER — NORGESTIMATE-ETH ESTRADIOL 0.25-35 MG-MCG PO TABS: 1.0000 | ORAL_TABLET | Freq: Every day | ORAL | 3 refills | Status: AC

## 2024-12-17 ENCOUNTER — Ambulatory Visit: Payer: Self-pay

## 2024-12-17 LAB — BETA HCG QUANT (REF LAB): hCG Quant: 122 m[IU]/mL

## 2024-12-18 NOTE — Telephone Encounter (Signed)
 Spoke with patient and scheduled her appointment for next week.

## 2024-12-18 NOTE — Telephone Encounter (Signed)
-----   Message from Harlene LITTIE Duncans sent at 12/17/2024 12:09 PM EST ----- Please call and schedule for repeat lab in 7-10 days. Thanks

## 2024-12-23 ENCOUNTER — Other Ambulatory Visit
